# Patient Record
Sex: Female | Born: 1950 | Race: Black or African American | Hispanic: No | Smoking: Never smoker
Health system: Southern US, Community
[De-identification: ages and names within clinical notes are randomized; demographics above are authoritative.]

## PROBLEM LIST (undated history)

## (undated) DIAGNOSIS — E785 Hyperlipidemia, unspecified: Secondary | ICD-10-CM

## (undated) DIAGNOSIS — I10 Essential (primary) hypertension: Secondary | ICD-10-CM

## (undated) DIAGNOSIS — R600 Localized edema: Secondary | ICD-10-CM

## (undated) DIAGNOSIS — G4700 Insomnia, unspecified: Secondary | ICD-10-CM

## (undated) DIAGNOSIS — M549 Dorsalgia, unspecified: Secondary | ICD-10-CM

## (undated) DIAGNOSIS — Z9289 Personal history of other medical treatment: Secondary | ICD-10-CM

## (undated) DIAGNOSIS — J189 Pneumonia, unspecified organism: Secondary | ICD-10-CM

## (undated) DIAGNOSIS — Z860100 Personal history of colon polyps, unspecified: Secondary | ICD-10-CM

## (undated) DIAGNOSIS — R350 Frequency of micturition: Secondary | ICD-10-CM

## (undated) DIAGNOSIS — M2550 Pain in unspecified joint: Secondary | ICD-10-CM

## (undated) DIAGNOSIS — R51 Headache: Secondary | ICD-10-CM

## (undated) DIAGNOSIS — R609 Edema, unspecified: Secondary | ICD-10-CM

## (undated) DIAGNOSIS — Z8489 Family history of other specified conditions: Secondary | ICD-10-CM

## (undated) DIAGNOSIS — R569 Unspecified convulsions: Secondary | ICD-10-CM

## (undated) DIAGNOSIS — R519 Headache, unspecified: Secondary | ICD-10-CM

## (undated) DIAGNOSIS — M1990 Unspecified osteoarthritis, unspecified site: Secondary | ICD-10-CM

## (undated) HISTORY — PX: COLONOSCOPY: SHX174

## (undated) HISTORY — PX: ABDOMINAL HYSTERECTOMY: SHX81

## (undated) HISTORY — PX: ESOPHAGOGASTRODUODENOSCOPY: SHX1529

---

## 2001-09-01 ENCOUNTER — Emergency Department (HOSPITAL_COMMUNITY): Admission: EM | Admit: 2001-09-01 | Discharge: 2001-09-01 | Payer: Self-pay | Admitting: *Deleted

## 2009-05-13 ENCOUNTER — Emergency Department (HOSPITAL_BASED_OUTPATIENT_CLINIC_OR_DEPARTMENT_OTHER): Admission: EM | Admit: 2009-05-13 | Discharge: 2009-05-13 | Payer: Self-pay | Admitting: Emergency Medicine

## 2013-03-06 ENCOUNTER — Other Ambulatory Visit: Payer: Self-pay | Admitting: Orthopaedic Surgery

## 2013-03-16 ENCOUNTER — Encounter (HOSPITAL_COMMUNITY)
Admission: RE | Admit: 2013-03-16 | Discharge: 2013-03-16 | Disposition: A | Discharge status: Home / Self Care | Payer: Commercial Managed Care - PPO | Source: Ambulatory Visit | Attending: Orthopaedic Surgery | Admitting: Orthopaedic Surgery

## 2013-03-16 ENCOUNTER — Encounter (HOSPITAL_COMMUNITY): Payer: Self-pay

## 2013-03-16 DIAGNOSIS — Z01810 Encounter for preprocedural cardiovascular examination: Secondary | ICD-10-CM | POA: Insufficient documentation

## 2013-03-16 DIAGNOSIS — Z01818 Encounter for other preprocedural examination: Secondary | ICD-10-CM | POA: Insufficient documentation

## 2013-03-16 DIAGNOSIS — Z01812 Encounter for preprocedural laboratory examination: Secondary | ICD-10-CM | POA: Insufficient documentation

## 2013-03-16 HISTORY — DX: Essential (primary) hypertension: I10

## 2013-03-16 HISTORY — DX: Hyperlipidemia, unspecified: E78.5

## 2013-03-16 HISTORY — DX: Frequency of micturition: R35.0

## 2013-03-16 LAB — CBC WITH DIFFERENTIAL/PLATELET
Basophils Absolute: 0 10*3/uL (ref 0.0–0.1)
Eosinophils Relative: 0 % (ref 0–5)
HCT: 32.2 % — ABNORMAL LOW (ref 36.0–46.0)
Hemoglobin: 10.9 g/dL — ABNORMAL LOW (ref 12.0–15.0)
Lymphocytes Relative: 41 % (ref 12–46)
Lymphs Abs: 2.9 10*3/uL (ref 0.7–4.0)
MCV: 93.6 fL (ref 78.0–100.0)
Monocytes Absolute: 0.5 10*3/uL (ref 0.1–1.0)
Monocytes Relative: 7 % (ref 3–12)
Neutro Abs: 3.7 10*3/uL (ref 1.7–7.7)
RBC: 3.44 MIL/uL — ABNORMAL LOW (ref 3.87–5.11)
RDW: 12.6 % (ref 11.5–15.5)
WBC: 7.1 10*3/uL (ref 4.0–10.5)

## 2013-03-16 LAB — URINALYSIS, ROUTINE W REFLEX MICROSCOPIC
Bilirubin Urine: NEGATIVE
Glucose, UA: NEGATIVE mg/dL
Leukocytes, UA: NEGATIVE
Nitrite: NEGATIVE
Specific Gravity, Urine: 1.014 (ref 1.005–1.030)
pH: 7 (ref 5.0–8.0)

## 2013-03-16 LAB — SURGICAL PCR SCREEN: Staphylococcus aureus: NEGATIVE

## 2013-03-16 LAB — APTT: aPTT: 33 seconds (ref 24–37)

## 2013-03-16 LAB — BASIC METABOLIC PANEL
CO2: 25 mEq/L (ref 19–32)
Chloride: 102 mEq/L (ref 96–112)
Creatinine, Ser: 0.94 mg/dL (ref 0.50–1.10)
GFR calc Af Amer: 74 mL/min — ABNORMAL LOW (ref >90)
Potassium: 3.8 mEq/L (ref 3.5–5.1)
Sodium: 139 mEq/L (ref 135–145)

## 2013-03-16 NOTE — Progress Notes (Signed)
03/16/13 1414  OBSTRUCTIVE SLEEP APNEA  Have you ever been diagnosed with sleep apnea through a sleep study? No  Do you snore loudly (loud enough to be heard through closed doors)?  1  Do you often feel tired, fatigued, or sleepy during the daytime? 1  Has anyone observed you stop breathing during your sleep? 0  Do you have, or are you being treated for high blood pressure? 1  BMI more than 35 kg/m2? 1  Age over 61 years old? 1  Neck circumference greater than 40 cm/18 inches? 0  Gender: 0  Obstructive Sleep Apnea Score 5  Score 4 or greater  Results sent to PCP

## 2013-03-16 NOTE — Pre-Procedure Instructions (Addendum)
Lamesha Tibbits Reindel  03/16/2013   Your procedure is scheduled on:  May 29  Report to [facility] at 09:00 AM.  Call this number if you have problems the morning of surgery: [phone]   Remember:   Do not eat food or drink liquids after midnight.   Take these medicines the morning of surgery with A SIP OF WATER: Hydrocodone (if needed), Oxybutin    Do not wear jewelry, make-up or nail polish.  Do not wear lotions, powders, or perfumes. You may wear deodorant.  Do not shave 48 hours prior to surgery. Men may shave face and neck.  Do not bring valuables to the hospital.  Contacts, dentures or bridgework may not be worn into surgery.  Leave suitcase in the car. After surgery it may be brought to your room.  For patients admitted to the hospital, checkout time is 11:00 AM the day of discharge.   Special Instructions: Shower using CHG 2 nights before surgery and the night before surgery.  If you shower the day of surgery use CHG.  Use special wash - you have one bottle of CHG for all showers.  You should use approximately 1/3 of the bottle for each shower.   Please read over the following fact sheets that you were given: Pain Booklet, Coughing and Deep Breathing, Blood Transfusion Information and Surgical Site Infection Prevention

## 2013-03-19 NOTE — H&P (Signed)
TOTAL HIP ADMISSION H&P  Patient is admitted for right total hip arthroplasty.  Subjective:  Chief Complaint: right hip pain  HPI: Angel Wolf, 62 y.o. female, has a history of pain and functional disability in the right hip(s) due to arthritis and patient has failed non-surgical conservative treatments for greater than 12 weeks to include NSAID's and/or analgesics, flexibility and strengthening excercises, weight reduction as appropriate and activity modification.  Onset of symptoms was gradual starting 4 years ago with gradually worsening course since that time.The patient noted no past surgery on the right hip(s).  Patient currently rates pain in the right hip at 8 out of 10 with activity. Patient has night pain, worsening of pain with activity and weight bearing, trendelenberg gait, pain that interfers with activities of daily living and pain with passive range of motion. Patient has evidence of joint space narrowing and avn per mri scan by imaging studies. This condition presents safety issues increasing the risk of falls. This patient has had avascular necrosis of the hip, acetabular fracture, hip dysplasia.  There is no current active infection.  There are no active problems to display for this patient.  Past Medical History  Diagnosis Date  . Hypertension   . Frequent urination   . Hyperlipemia   . History of anemia     Past Surgical History  Procedure Laterality Date  . Abdominal hysterectomy      No prescriptions prior to admission   No Known Allergies  History  Substance Use Topics  . Smoking status: Never Smoker   . Smokeless tobacco: Not on file  . Alcohol Use: Yes     Comment: Rare on special occasions    No family history on file.   Review of Systems  Constitutional: Negative.   HENT: Negative.   Eyes: Negative.   Respiratory: Negative.   Cardiovascular: Negative.   Gastrointestinal: Negative.   Genitourinary: Negative.   Musculoskeletal: Positive  for joint pain.  Skin: Negative.   Neurological: Negative.   Endo/Heme/Allergies: Negative.   Psychiatric/Behavioral: Negative.     Objective:  Physical Exam  Constitutional: She is oriented to person, place, and time. She appears well-nourished.  HENT:  Head: Normocephalic.  Eyes: Pupils are equal, round, and reactive to light.  Neck: Normal range of motion.  Cardiovascular: Normal rate.   Respiratory: Breath sounds normal.  GI: Bowel sounds are normal.  Musculoskeletal:  r hip pain with weight bearing. Severe pain with rotation which is very limited. Good n/v  Neurological: She is oriented to person, place, and time.  Skin: Skin is warm.  Psychiatric: She has a normal mood and affect.    Vital signs in last 24 hours:    Labs:   There is no height or weight on file to calculate BMI.   Imaging Review Plain radiographs demonstrate moderate degenerative joint disease of the right hip(s). The bone quality appears to be good for age and reported activity level.  Assessment/Plan:  End stage arthritis, right hip(s)  The patient history, physical examination, clinical judgement of the provider and imaging studies are consistent with end stage degenerative joint disease of the right hip(s) and total hip arthroplasty is deemed medically necessary. The treatment options including medical management, injection therapy, arthroscopy and arthroplasty were discussed at length. The risks and benefits of total hip arthroplasty were presented and reviewed. The risks due to aseptic loosening, infection, stiffness, dislocation/subluxation,  thromboembolic complications and other imponderables were discussed.  The patient acknowledged the explanation, agreed to  proceed with the plan and consent was signed. Patient is being admitted for inpatient treatment for surgery, pain control, PT, OT, prophylactic antibiotics, VTE prophylaxis, progressive ambulation and ADL's and discharge planning.The  patient is planning to be discharged home with home health services

## 2013-03-22 MED ORDER — CEFAZOLIN SODIUM-DEXTROSE 2-3 GM-% IV SOLR
2.0000 g | INTRAVENOUS | Status: AC
  Administered 2013-03-23: 2 g via INTRAVENOUS

## 2013-03-22 MED ORDER — CHLORHEXIDINE GLUCONATE 4 % EX LIQD: 60.0000 mL | Freq: Once | CUTANEOUS | Status: DC

## 2013-03-22 NOTE — Progress Notes (Signed)
Pt called and confirmed arrival time of 0800.

## 2013-03-22 NOTE — Progress Notes (Signed)
I left a phone message on pt s home number of ti,e change, pt to arrive at 0800.  I asked patient to return call to confirm.

## 2013-03-23 ENCOUNTER — Encounter (HOSPITAL_COMMUNITY)
Admission: RE | Disposition: A | Discharge status: Home Health | Payer: Self-pay | Source: Ambulatory Visit | Attending: Orthopaedic Surgery

## 2013-03-23 ENCOUNTER — Inpatient Hospital Stay (HOSPITAL_COMMUNITY): Payer: Commercial Managed Care - PPO

## 2013-03-23 ENCOUNTER — Inpatient Hospital Stay (HOSPITAL_COMMUNITY): Payer: Commercial Managed Care - PPO | Admitting: Anesthesiology

## 2013-03-23 ENCOUNTER — Encounter (HOSPITAL_COMMUNITY): Payer: Self-pay | Admitting: Anesthesiology

## 2013-03-23 ENCOUNTER — Encounter (HOSPITAL_COMMUNITY): Payer: Self-pay | Admitting: *Deleted

## 2013-03-23 ENCOUNTER — Inpatient Hospital Stay (HOSPITAL_COMMUNITY)
Admission: RE | Admit: 2013-03-23 | Discharge: 2013-03-26 | DRG: 470 | Disposition: A | Discharge status: Home Health | Payer: Commercial Managed Care - PPO | Source: Ambulatory Visit | Attending: Orthopaedic Surgery | Admitting: Orthopaedic Surgery

## 2013-03-23 DIAGNOSIS — M169 Osteoarthritis of hip, unspecified: Secondary | ICD-10-CM | POA: Diagnosis present

## 2013-03-23 DIAGNOSIS — M1610 Unilateral primary osteoarthritis, unspecified hip: Principal | ICD-10-CM | POA: Diagnosis present

## 2013-03-23 DIAGNOSIS — D62 Acute posthemorrhagic anemia: Secondary | ICD-10-CM | POA: Diagnosis not present

## 2013-03-23 DIAGNOSIS — E785 Hyperlipidemia, unspecified: Secondary | ICD-10-CM | POA: Diagnosis present

## 2013-03-23 DIAGNOSIS — Z7982 Long term (current) use of aspirin: Secondary | ICD-10-CM

## 2013-03-23 DIAGNOSIS — Z79899 Other long term (current) drug therapy: Secondary | ICD-10-CM

## 2013-03-23 DIAGNOSIS — I10 Essential (primary) hypertension: Secondary | ICD-10-CM | POA: Diagnosis present

## 2013-03-23 HISTORY — PX: TOTAL HIP ARTHROPLASTY: SHX124

## 2013-03-23 LAB — TYPE AND SCREEN
ABO/RH(D): A POS
Antibody Screen: NEGATIVE

## 2013-03-23 SURGERY — ARTHROPLASTY, HIP, TOTAL, ANTERIOR APPROACH
Anesthesia: General | Site: Hip | Laterality: Right | Wound class: Clean

## 2013-03-23 MED ORDER — PHENOL 1.4 % MT LIQD: 1.0000 | OROMUCOSAL | Status: DC | PRN

## 2013-03-23 MED ORDER — METHOCARBAMOL 500 MG PO TABS
ORAL_TABLET | ORAL | Status: AC
  Filled 2013-03-23: qty 1

## 2013-03-23 MED ORDER — ONDANSETRON HCL 4 MG/2ML IJ SOLN: 4.0000 mg | Freq: Four times a day (QID) | INTRAMUSCULAR | Status: DC | PRN

## 2013-03-23 MED ORDER — ALUM & MAG HYDROXIDE-SIMETH 200-200-20 MG/5ML PO SUSP: 30.0000 mL | ORAL | Status: DC | PRN

## 2013-03-23 MED ORDER — HYDROCHLOROTHIAZIDE 12.5 MG PO CAPS
12.5000 mg | ORAL_CAPSULE | Freq: Every day | ORAL | Status: DC
  Administered 2013-03-23 – 2013-03-26 (×3): 12.5 mg via ORAL
  Filled 2013-03-23 (×4): qty 1

## 2013-03-23 MED ORDER — HYDRALAZINE HCL 20 MG/ML IJ SOLN
INTRAMUSCULAR | Status: AC
  Administered 2013-03-23: 10 mg
  Filled 2013-03-23: qty 1

## 2013-03-23 MED ORDER — OXYCODONE HCL 5 MG/5ML PO SOLN: 5.0000 mg | Freq: Once | ORAL | Status: AC | PRN

## 2013-03-23 MED ORDER — ONDANSETRON HCL 4 MG/2ML IJ SOLN
INTRAMUSCULAR | Status: AC
  Filled 2013-03-23: qty 2

## 2013-03-23 MED ORDER — HYDROMORPHONE HCL PF 1 MG/ML IJ SOLN
0.2500 mg | INTRAMUSCULAR | Status: DC | PRN
  Administered 2013-03-23 (×4): 0.5 mg via INTRAVENOUS

## 2013-03-23 MED ORDER — LACTATED RINGERS IV SOLN
INTRAVENOUS | Status: DC
  Administered 2013-03-24: 02:00:00 via INTRAVENOUS

## 2013-03-23 MED ORDER — PROPOFOL 10 MG/ML IV BOLUS
INTRAVENOUS | Status: DC | PRN
  Administered 2013-03-23: 150 mg via INTRAVENOUS

## 2013-03-23 MED ORDER — ONDANSETRON HCL 4 MG PO TABS
4.0000 mg | ORAL_TABLET | Freq: Four times a day (QID) | ORAL | Status: DC | PRN
  Administered 2013-03-26: 4 mg via ORAL
  Filled 2013-03-23: qty 1

## 2013-03-23 MED ORDER — LOSARTAN POTASSIUM-HCTZ 100-12.5 MG PO TABS: 1.0000 | ORAL_TABLET | Freq: Every day | ORAL | Status: DC

## 2013-03-23 MED ORDER — LIDOCAINE HCL (CARDIAC) 20 MG/ML IV SOLN
INTRAVENOUS | Status: DC | PRN
  Administered 2013-03-23: 80 mg via INTRAVENOUS

## 2013-03-23 MED ORDER — METHOCARBAMOL 500 MG PO TABS
500.0000 mg | ORAL_TABLET | Freq: Four times a day (QID) | ORAL | Status: DC | PRN
  Administered 2013-03-23 – 2013-03-25 (×5): 500 mg via ORAL
  Filled 2013-03-23 (×4): qty 1

## 2013-03-23 MED ORDER — SIMVASTATIN 20 MG PO TABS
20.0000 mg | ORAL_TABLET | Freq: Every day | ORAL | Status: DC
  Administered 2013-03-23 – 2013-03-25 (×3): 20 mg via ORAL
  Filled 2013-03-23 (×4): qty 1

## 2013-03-23 MED ORDER — OXYBUTYNIN CHLORIDE 5 MG PO TABS
5.0000 mg | ORAL_TABLET | Freq: Every day | ORAL | Status: DC
Start: 2013-03-24 — End: 2013-03-26
  Administered 2013-03-24 – 2013-03-26 (×2): 5 mg via ORAL
  Filled 2013-03-23 (×3): qty 1

## 2013-03-23 MED ORDER — HYDROMORPHONE HCL PF 1 MG/ML IJ SOLN
INTRAMUSCULAR | Status: AC
  Filled 2013-03-23: qty 1

## 2013-03-23 MED ORDER — LIDOCAINE HCL 4 % MT SOLN
OROMUCOSAL | Status: DC | PRN
  Administered 2013-03-23: 4 mL via TOPICAL

## 2013-03-23 MED ORDER — BISACODYL 5 MG PO TBEC
5.0000 mg | DELAYED_RELEASE_TABLET | Freq: Every day | ORAL | Status: DC | PRN
  Administered 2013-03-25: 5 mg via ORAL
  Filled 2013-03-23: qty 1

## 2013-03-23 MED ORDER — ONDANSETRON HCL 4 MG/2ML IJ SOLN
4.0000 mg | Freq: Four times a day (QID) | INTRAMUSCULAR | Status: DC | PRN
  Administered 2013-03-25: 4 mg via INTRAVENOUS
  Filled 2013-03-23: qty 2

## 2013-03-23 MED ORDER — OXYCODONE HCL 5 MG PO TABS
5.0000 mg | ORAL_TABLET | Freq: Once | ORAL | Status: AC | PRN
  Administered 2013-03-23: 5 mg via ORAL

## 2013-03-23 MED ORDER — ASPIRIN EC 325 MG PO TBEC
325.0000 mg | DELAYED_RELEASE_TABLET | Freq: Two times a day (BID) | ORAL | Status: DC
  Administered 2013-03-24 – 2013-03-26 (×5): 325 mg via ORAL
  Filled 2013-03-23 (×7): qty 1

## 2013-03-23 MED ORDER — DOCUSATE SODIUM 100 MG PO CAPS
100.0000 mg | ORAL_CAPSULE | Freq: Two times a day (BID) | ORAL | Status: DC
  Administered 2013-03-23 – 2013-03-26 (×6): 100 mg via ORAL
  Filled 2013-03-23 (×7): qty 1

## 2013-03-23 MED ORDER — ONDANSETRON HCL 4 MG/2ML IJ SOLN
4.0000 mg | Freq: Once | INTRAMUSCULAR | Status: AC | PRN
  Administered 2013-03-23: 4 mg via INTRAVENOUS

## 2013-03-23 MED ORDER — LOSARTAN POTASSIUM 50 MG PO TABS
100.0000 mg | ORAL_TABLET | Freq: Every day | ORAL | Status: DC
  Administered 2013-03-23 – 2013-03-26 (×3): 100 mg via ORAL
  Filled 2013-03-23 (×4): qty 2

## 2013-03-23 MED ORDER — LACTATED RINGERS IV SOLN
INTRAVENOUS | Status: DC
  Administered 2013-03-23: 09:00:00 via INTRAVENOUS

## 2013-03-23 MED ORDER — ACETAMINOPHEN 325 MG PO TABS: 650.0000 mg | ORAL_TABLET | Freq: Four times a day (QID) | ORAL | Status: DC | PRN

## 2013-03-23 MED ORDER — DEXTROSE 5 % IV SOLN: 500.0000 mg | Freq: Four times a day (QID) | INTRAVENOUS | Status: DC | PRN

## 2013-03-23 MED ORDER — ROCURONIUM BROMIDE 100 MG/10ML IV SOLN
INTRAVENOUS | Status: DC | PRN
  Administered 2013-03-23: 50 mg via INTRAVENOUS

## 2013-03-23 MED ORDER — SODIUM CHLORIDE 0.9 % IV SOLN
10.0000 mg | INTRAVENOUS | Status: DC | PRN
  Administered 2013-03-23: 5 ug/min via INTRAVENOUS

## 2013-03-23 MED ORDER — FENTANYL CITRATE 0.05 MG/ML IJ SOLN
INTRAMUSCULAR | Status: AC
  Administered 2013-03-23: 25 ug
  Filled 2013-03-23: qty 2

## 2013-03-23 MED ORDER — MIDAZOLAM HCL 5 MG/5ML IJ SOLN
INTRAMUSCULAR | Status: DC | PRN
  Administered 2013-03-23 (×2): 1 mg via INTRAVENOUS

## 2013-03-23 MED ORDER — OXYCODONE HCL 5 MG PO TABS
ORAL_TABLET | ORAL | Status: AC
  Filled 2013-03-23: qty 1

## 2013-03-23 MED ORDER — HYDROCODONE-ACETAMINOPHEN 7.5-325 MG PO TABS
1.0000 | ORAL_TABLET | ORAL | Status: DC | PRN
  Administered 2013-03-23: 1 via ORAL
  Administered 2013-03-24: 2 via ORAL
  Administered 2013-03-24: 1 via ORAL
  Administered 2013-03-24 – 2013-03-25 (×4): 2 via ORAL
  Filled 2013-03-23 (×6): qty 2
  Filled 2013-03-23: qty 1

## 2013-03-23 MED ORDER — GLYCOPYRROLATE 0.2 MG/ML IJ SOLN
INTRAMUSCULAR | Status: DC | PRN
  Administered 2013-03-23: .4 mg via INTRAVENOUS

## 2013-03-23 MED ORDER — NEOSTIGMINE METHYLSULFATE 1 MG/ML IJ SOLN
INTRAMUSCULAR | Status: DC | PRN
  Administered 2013-03-23: 4 mg via INTRAVENOUS

## 2013-03-23 MED ORDER — LACTATED RINGERS IV SOLN
INTRAVENOUS | Status: DC | PRN
  Administered 2013-03-23 (×2): via INTRAVENOUS

## 2013-03-23 MED ORDER — CEFAZOLIN SODIUM-DEXTROSE 2-3 GM-% IV SOLR
INTRAVENOUS | Status: AC
  Filled 2013-03-23: qty 50

## 2013-03-23 MED ORDER — 0.9 % SODIUM CHLORIDE (POUR BTL) OPTIME
TOPICAL | Status: DC | PRN
  Administered 2013-03-23: 1000 mL

## 2013-03-23 MED ORDER — METOCLOPRAMIDE HCL 10 MG PO TABS: 5.0000 mg | ORAL_TABLET | Freq: Three times a day (TID) | ORAL | Status: DC | PRN

## 2013-03-23 MED ORDER — FENTANYL CITRATE 0.05 MG/ML IJ SOLN
INTRAMUSCULAR | Status: DC | PRN
  Administered 2013-03-23: 25 ug via INTRAVENOUS
  Administered 2013-03-23: 50 ug via INTRAVENOUS
  Administered 2013-03-23: 150 ug via INTRAVENOUS
  Administered 2013-03-23: 50 ug via INTRAVENOUS
  Administered 2013-03-23: 25 ug via INTRAVENOUS
  Administered 2013-03-23: 100 ug via INTRAVENOUS

## 2013-03-23 MED ORDER — METOCLOPRAMIDE HCL 5 MG/ML IJ SOLN: 5.0000 mg | Freq: Three times a day (TID) | INTRAMUSCULAR | Status: DC | PRN

## 2013-03-23 MED ORDER — FERROUS SULFATE 325 (65 FE) MG PO TABS
325.0000 mg | ORAL_TABLET | Freq: Every day | ORAL | Status: DC
  Administered 2013-03-24 – 2013-03-25 (×2): 325 mg via ORAL
  Filled 2013-03-23 (×3): qty 1

## 2013-03-23 MED ORDER — CEFAZOLIN SODIUM-DEXTROSE 2-3 GM-% IV SOLR
2.0000 g | Freq: Four times a day (QID) | INTRAVENOUS | Status: AC
  Administered 2013-03-23 – 2013-03-24 (×2): 2 g via INTRAVENOUS
  Filled 2013-03-23 (×2): qty 50

## 2013-03-23 MED ORDER — ACETAMINOPHEN 650 MG RE SUPP: 650.0000 mg | Freq: Four times a day (QID) | RECTAL | Status: DC | PRN

## 2013-03-23 MED ORDER — MENTHOL 3 MG MT LOZG: 1.0000 | LOZENGE | OROMUCOSAL | Status: DC | PRN

## 2013-03-23 MED ORDER — HYDROMORPHONE HCL PF 1 MG/ML IJ SOLN
0.5000 mg | INTRAMUSCULAR | Status: DC | PRN
  Administered 2013-03-23 – 2013-03-24 (×3): 1 mg via INTRAVENOUS
  Filled 2013-03-23 (×3): qty 1

## 2013-03-23 SURGICAL SUPPLY — 52 items
BLADE SAW SGTL 18X1.27X75 (BLADE) ×2 IMPLANT
BLADE SURG ROTATE 9660 (MISCELLANEOUS) IMPLANT
CELLS DAT CNTRL 66122 CELL SVR (MISCELLANEOUS) ×1 IMPLANT
CLOTH BEACON ORANGE TIMEOUT ST (SAFETY) ×2 IMPLANT
COVER BACK TABLE 24X17X13 BIG (DRAPES) IMPLANT
COVER SURGICAL LIGHT HANDLE (MISCELLANEOUS) ×2 IMPLANT
DRAPE C-ARM 42X72 X-RAY (DRAPES) ×2 IMPLANT
DRAPE STERI IOBAN 125X83 (DRAPES) ×2 IMPLANT
DRAPE U-SHAPE 47X51 STRL (DRAPES) ×6 IMPLANT
DRSG MEPILEX BORDER 4X12 (GAUZE/BANDAGES/DRESSINGS) IMPLANT
DRSG MEPILEX BORDER 4X8 (GAUZE/BANDAGES/DRESSINGS) ×2 IMPLANT
DURAPREP 26ML APPLICATOR (WOUND CARE) ×2 IMPLANT
ELECT BLADE 4.0 EZ CLEAN MEGAD (MISCELLANEOUS)
ELECT BLADE TIP CTD 4 INCH (ELECTRODE) ×2 IMPLANT
ELECT CAUTERY BLADE 6.4 (BLADE) ×2 IMPLANT
ELECT REM PT RETURN 9FT ADLT (ELECTROSURGICAL) ×2
ELECTRODE BLDE 4.0 EZ CLN MEGD (MISCELLANEOUS) IMPLANT
ELECTRODE REM PT RTRN 9FT ADLT (ELECTROSURGICAL) ×1 IMPLANT
FACESHIELD LNG OPTICON STERILE (SAFETY) ×4 IMPLANT
GAUZE XEROFORM 1X8 LF (GAUZE/BANDAGES/DRESSINGS) ×2 IMPLANT
GLOVE BIO SURGEON STRL SZ8 (GLOVE) ×2 IMPLANT
GLOVE BIO SURGEON STRL SZ8.5 (GLOVE) ×2 IMPLANT
GLOVE BIOGEL PI IND STRL 8 (GLOVE) ×1 IMPLANT
GLOVE BIOGEL PI IND STRL 8.5 (GLOVE) ×1 IMPLANT
GLOVE BIOGEL PI INDICATOR 8 (GLOVE) ×1
GLOVE BIOGEL PI INDICATOR 8.5 (GLOVE) ×1
GOWN PREVENTION PLUS LG XLONG (DISPOSABLE) IMPLANT
GOWN STRL NON-REIN LRG LVL3 (GOWN DISPOSABLE) ×4 IMPLANT
GOWN STRL REIN XL XLG (GOWN DISPOSABLE) ×2 IMPLANT
KIT BASIN OR (CUSTOM PROCEDURE TRAY) ×2 IMPLANT
KIT ROOM TURNOVER OR (KITS) ×2 IMPLANT
MANIFOLD NEPTUNE II (INSTRUMENTS) ×2 IMPLANT
NS IRRIG 1000ML POUR BTL (IV SOLUTION) ×2 IMPLANT
PACK TOTAL JOINT (CUSTOM PROCEDURE TRAY) ×2 IMPLANT
PAD ARMBOARD 7.5X6 YLW CONV (MISCELLANEOUS) ×4 IMPLANT
RETRACTOR WND ALEXIS 18 MED (MISCELLANEOUS) ×1 IMPLANT
RTRCTR WOUND ALEXIS 18CM MED (MISCELLANEOUS) ×2
SPONGE LAP 18X18 X RAY DECT (DISPOSABLE) ×2 IMPLANT
SPONGE LAP 4X18 X RAY DECT (DISPOSABLE) IMPLANT
STAPLER VISISTAT 35W (STAPLE) ×2 IMPLANT
SUT ETHIBOND NAB CT1 #1 30IN (SUTURE) ×4 IMPLANT
SUT VIC AB 0 CT1 27 (SUTURE)
SUT VIC AB 0 CT1 27XBRD ANBCTR (SUTURE) IMPLANT
SUT VIC AB 1 CT1 27 (SUTURE) ×2
SUT VIC AB 1 CT1 27XBRD ANBCTR (SUTURE) ×1 IMPLANT
SUT VIC AB 2-0 CT1 27 (SUTURE) ×2
SUT VIC AB 2-0 CT1 TAPERPNT 27 (SUTURE) ×1 IMPLANT
SUT VLOC 180 0 24IN GS25 (SUTURE) ×2 IMPLANT
TOWEL OR 17X24 6PK STRL BLUE (TOWEL DISPOSABLE) ×2 IMPLANT
TOWEL OR 17X26 10 PK STRL BLUE (TOWEL DISPOSABLE) ×4 IMPLANT
TRAY FOLEY CATH 14FR (SET/KITS/TRAYS/PACK) IMPLANT
WATER STERILE IRR 1000ML POUR (IV SOLUTION) ×4 IMPLANT

## 2013-03-23 NOTE — Anesthesia Preprocedure Evaluation (Signed)
Anesthesia Evaluation  Patient identified by MRN, date of birth, ID band Patient awake    Reviewed: Allergy & Precautions, H&P , NPO status , Patient's Chart, lab work & pertinent test results  Airway Mallampati: II  Neck ROM: full    Dental   Pulmonary          Cardiovascular hypertension,     Neuro/Psych    GI/Hepatic   Endo/Other  obese  Renal/GU      Musculoskeletal   Abdominal   Peds  Hematology   Anesthesia Other Findings   Reproductive/Obstetrics                           Anesthesia Physical Anesthesia Plan  ASA: II  Anesthesia Plan: General   Post-op Pain Management:    Induction: Intravenous  Airway Management Planned: Oral ETT  Additional Equipment:   Intra-op Plan:   Post-operative Plan: Extubation in OR  Informed Consent: I have reviewed the patients History and Physical, chart, labs and discussed the procedure including the risks, benefits and alternatives for the proposed anesthesia with the patient or authorized representative who has indicated his/her understanding and acceptance.     Plan Discussed with: CRNA, Anesthesiologist and Surgeon  Anesthesia Plan Comments:         Anesthesia Quick Evaluation

## 2013-03-23 NOTE — Interval H&P Note (Signed)
History and Physical Interval Note:  03/23/2013 9:36 AM  Angel Wolf  has presented today for surgery, with the diagnosis of RIGHT HIP DEGENERATIVE JOINT DISEASE  The various methods of treatment have been discussed with the patient and family. After consideration of risks, benefits and other options for treatment, the patient has consented to  Procedure(s): RIGHT TOTAL HIP ARTHROPLASTY ANTERIOR APPROACH (Right) as a surgical intervention .  The patient's history has been reviewed, patient examined, no change in status, stable for surgery.  I have reviewed the patient's chart and labs.  Questions were answered to the patient's satisfaction.     Foy Mungia G

## 2013-03-23 NOTE — Transfer of Care (Signed)
Immediate Anesthesia Transfer of Care Note  Patient: Angel Wolf  Procedure(s) Performed: Procedure(s): RIGHT TOTAL HIP ARTHROPLASTY ANTERIOR APPROACH (Right)  Patient Location: PACU  Anesthesia Type:General  Level of Consciousness: awake, sedated, patient cooperative, confused and responds to stimulation  Airway & Oxygen Therapy: Patient Spontanous Breathing and Patient connected to nasal cannula oxygen  Post-op Assessment: Report given to PACU RN, Post -op Vital signs reviewed and stable, Patient moving all extremities and Patient moving all extremities X 4  Post vital signs: Reviewed and stable  Complications: No apparent anesthesia complications

## 2013-03-23 NOTE — Progress Notes (Signed)
Lunch relief by Silvestre Gunner RN

## 2013-03-23 NOTE — Progress Notes (Signed)
Pt rates pain 8/10 after 2 mg IV Dilaudid, po OXY IR & Robaxin.  RR 8-10. SBP 180-190's. Dr Alonna Buckler notified. IV Hydralazine given for elev. BP--will cont to monitor BP & pain issues.

## 2013-03-23 NOTE — Anesthesia Procedure Notes (Signed)
Procedure Name: Intubation Date/Time: 03/23/2013 10:11 AM Performed by: Armandina Gemma Pre-anesthesia Checklist: Patient identified, Timeout performed, Emergency Drugs available, Suction available and Patient being monitored Patient Re-evaluated:Patient Re-evaluated prior to inductionOxygen Delivery Method: Circle system utilized Preoxygenation: Pre-oxygenation with 100% oxygen Intubation Type: IV induction Ventilation: Mask ventilation without difficulty Laryngoscope Size: Miller and 2 Grade View: Grade I Tube type: Oral Tube size: 7.0 mm Number of attempts: 1 Airway Equipment and Method: Stylet Placement Confirmation: ETT inserted through vocal cords under direct vision,  breath sounds checked- equal and bilateral and positive ETCO2 Secured at: 22 cm Tube secured with: Tape Dental Injury: Teeth and Oropharynx as per pre-operative assessment  Comments: IV induction Hodierene- intubation AM atraumatic teeth as preop

## 2013-03-23 NOTE — Op Note (Signed)
PRE-OP DIAGNOSIS:  RIGHT HIP DEGENERATIVE JOINT DISEASE POST-OP DIAGNOSIS:  RIGHT HIP DEGENERATIVE JOINT DISEASE PROCEDURE:  Procedure(s): RIGHT TOTAL HIP ARTHROPLASTY ANTERIOR APPROACH ANESTHESIA:  General SURGEON:  Marcene Corning MD ASSISTANT:  Lindwood Qua PA-C and Patrick Jupiter RNFA   INDICATIONS FOR PROCEDURE:  The patient is a 62 y.o. female with a long history of a painful hip.  This has persisted despite multiple conservative measures.  The patient has persisted with pain and dysfunction making rest and activity difficult.  A total hip replacement is offered as surgical treatment.  Informed operative consent was obtained after discussion of possible complications including reaction to anesthesia, infection, neurovascular injury, dislocation, DVT, PE, and death.  The importance of the postoperative rehab program to optimize result was stressed with the patient.  SUMMARY OF FINDINGS AND PROCEDURE:  Under general anesthesia through a anterior approach an the Hana table a right THR was performed.  The patient had severe degenerative change and excellent bone quality.  We used DePuy components to replace the hip and these were size KA12 Corail femur capped with a +1 32mm ceramic hip ball.  On the acetabular side we used a size 50 Gription shell with a  plus 4 neutral polyethylene liner.  We did use a hole eliminator.  Bryna Colander assisted throughout and was invaluable to the completion of the case in that he helped position and retract while I performed the procedure.  He also closed simultaneously to help minimize OR time.  I used fluoroscopy throughout the case to check position of implants and leg lengths and read all of these views myself.  DESCRIPTION OF PROCEDURE:  The patient was taken to the OR suite where general anesthetic was applied.  The patient was then positioned on the Hana table supine.  All bony prominences were appropriately padded.  Prep and drape was then performed in  normal sterile fashion.  The patient was given Kefzol preoperative antibiotic and an appropriate time out was performed.  We then took an anterior approach to the right hip.  Dissection was taken through adipose to the tensor fascia lata fascia.  This structure was incised longitudinally and we dissected in the intermuscular interval just medial to this muscle.  Cobra retractors were placed superior and inferior to the femoral neck superficial to the capsule.  A capsular incision was then made and the retractors were placed along the femoral neck.  Xray was brought in to get a good level for the femoral neck cut which was made with an oscillating saw and osteotome.  The femoral head was removed with a corkscrew.  The acetabulum was exposed and some labral tissues were excised. Reaming was taken to the inside wall of the pelvis and sequentially up to 1 mm smaller than the actual component.  A trial of components was done and then the aforementioned acetabular shell was placed in appropriate tilt and anteversion confirmed by fluoroscopy. The liner was placed along with the hole eliminator and attention was turned to the femur.  The leg was brought down and over into adduction and the elevator bar was used to raise the femur up gently in the wound.  The piriformis was released with care taken to preserve the obturator internus attachment and all of the posterior capsule. The femur was reamed and then broached to the appropriate size.  A trial reduction was done and the aforementioned head and neck assembly gave [place] the best stability in extension with external rotation.  Leg lengths were  felt to be about equal by fluoroscopic exam.  The trial components were removed and the wound irrigated.  We then placed the femoral component in appropriate anteversion.  The head was applied to a dry stem neck and the hip again reduced.  It was again stable in the aforementioned position.  The would was irrigated again followed by  re-approximation of anterior capsule with ethibond suture. Tensor fascia was repaired with V-loc suture  followed by subcutaneous closure with #O and #2 undyed vicryl.  Skin was closed with staples followed by a sterile dressing.  EBL and IOF can be obtained from anesthesia records.  DISPOSITION:  The patient was extubated in the OR and taken to PACU in stable condition to be admitted to the Orthopedic Surgery for appropriate post-op care to include perioperative antibiotics and DVT prophylaxis.

## 2013-03-23 NOTE — Preoperative (Signed)
Beta Blockers   Reason not to administer Beta Blockers:Not Applicable 

## 2013-03-23 NOTE — Anesthesia Postprocedure Evaluation (Signed)
Anesthesia Post Note  Patient: Angel Wolf  Procedure(s) Performed: Procedure(s) (LRB): RIGHT TOTAL HIP ARTHROPLASTY ANTERIOR APPROACH (Right)  Anesthesia type: General  Patient location: PACU  Post pain: Pain level controlled and Adequate analgesia  Post assessment: Post-op Vital signs reviewed, Patient's Cardiovascular Status Stable, Respiratory Function Stable, Patent Airway and Pain level controlled  Last Vitals:  Filed Vitals:   03/23/13 1400  BP: 167/68  Pulse: 52  Temp:   Resp: 12    Post vital signs: Reviewed and stable  Level of consciousness: awake, alert  and oriented  Complications: No apparent anesthesia complications

## 2013-03-24 ENCOUNTER — Encounter (HOSPITAL_COMMUNITY): Payer: Self-pay | Admitting: Orthopaedic Surgery

## 2013-03-24 LAB — POCT I-STAT 4, (NA,K, GLUC, HGB,HCT)
HCT: 29 % — ABNORMAL LOW (ref 36.0–46.0)
Hemoglobin: 9.9 g/dL — ABNORMAL LOW (ref 12.0–15.0)
Sodium: 139 mEq/L (ref 135–145)

## 2013-03-24 LAB — CBC
HCT: 26.2 % — ABNORMAL LOW (ref 36.0–46.0)
MCV: 93.9 fL (ref 78.0–100.0)
RBC: 2.79 MIL/uL — ABNORMAL LOW (ref 3.87–5.11)
WBC: 9 10*3/uL (ref 4.0–10.5)

## 2013-03-24 LAB — BASIC METABOLIC PANEL
BUN: 12 mg/dL (ref 6–23)
CO2: 28 mEq/L (ref 19–32)
Chloride: 97 mEq/L (ref 96–112)
Creatinine, Ser: 0.96 mg/dL (ref 0.50–1.10)

## 2013-03-24 MED ORDER — METHOCARBAMOL 500 MG PO TABS: 500.0000 mg | ORAL_TABLET | Freq: Four times a day (QID) | ORAL | Status: DC | PRN

## 2013-03-24 MED ORDER — HYDROCODONE-ACETAMINOPHEN 7.5-325 MG PO TABS: 1.0000 | ORAL_TABLET | ORAL | Status: DC | PRN

## 2013-03-24 MED ORDER — FERROUS SULFATE 325 (65 FE) MG PO TABS: 325.0000 mg | ORAL_TABLET | Freq: Every day | ORAL | Status: AC

## 2013-03-24 MED ORDER — ASPIRIN 325 MG PO TBEC: 325.0000 mg | DELAYED_RELEASE_TABLET | Freq: Two times a day (BID) | ORAL | Status: DC

## 2013-03-24 NOTE — Evaluation (Signed)
Physical Therapy Evaluation Patient Details Name: Angel Wolf MRN: [id number] DOB: 1951/10/07 Today's Date: 03/24/2013 Time: 3086-5784 PT Time Calculation (min): 27 min  PT Assessment / Plan / Recommendation Clinical Impression  Pt is a 62 y.o. female s/p R THA POD#1. Pt limited in therapy today due to pain and nausea with activity. Requires max encouragement to participate. Pt presents with decreased mobility, decreased independence with gt/transfers. Will benefit from skilled PT to maximize functional mobility and ensure safe transition home with family. Will recommend HHPT upon acute D/C.    PT Assessment  Patient needs continued PT services    Follow Up Recommendations  Home health PT;Supervision/Assistance - 24 hour    Does the patient have the potential to tolerate intense rehabilitation      Barriers to Discharge None      Equipment Recommendations  Rolling walker with 5" wheels;Other (comment) (3 in 1)    Recommendations for Other Services     Frequency 7X/week    Precautions / Restrictions Precautions Precautions: Fall Precaution Comments: given handout on exercises for THA protocol Restrictions Weight Bearing Restrictions: Yes RLE Weight Bearing: Weight bearing as tolerated   Pertinent Vitals/Pain 8/10; pt premedicated.       Mobility  Bed Mobility Bed Mobility: Supine to Sit;Sitting - Scoot to Edge of Bed Supine to Sit: 4: Min assist;HOB elevated;With rails Sitting - Scoot to Edge of Bed: 4: Min guard;With rail Details for Bed Mobility Assistance: requires (A) to advance R LE to/off EOB; increased time due to pain; pt became nausous and began to vomit while sitting at EOB; deferred amb at this time; agreeable to SPT to chair  Transfers Transfers: Sit to Stand;Stand to Sit;Stand Pivot Transfers Sit to Stand: 3: Mod assist;From bed;From elevated surface Stand to Sit: 3: Mod assist;To chair/3-in-1;With armrests;With upper extremity assist Stand Pivot  Transfers: 3: Mod assist Details for Transfer Assistance: verbal cues for hand placement and safety with RW;  pt requires (A) to steady when intially standing due to decreased ability to WB through R LE due to pain; denies dizziness  Ambulation/Gait Ambulation/Gait Assistance: Not tested (comment) Stairs: No Wheelchair Mobility Wheelchair Mobility: No    Exercises Total Joint Exercises Ankle Circles/Pumps: AROM;Both;10 reps Gluteal Sets: AROM;Both;Seated Long Arc Quad: AROM;Right;10 reps;Seated   PT Diagnosis: Difficulty walking;Acute pain  PT Problem List: Decreased strength;Decreased range of motion;Decreased activity tolerance;Decreased balance;Decreased mobility;Decreased knowledge of use of DME;Pain PT Treatment Interventions: DME instruction;Gait training;Stair training;Functional mobility training;Therapeutic activities;Therapeutic exercise;Balance training;Neuromuscular re-education;Patient/family education   PT Goals Acute Rehab PT Goals PT Goal Formulation: With patient Time For Goal Achievement: 03/31/13 Potential to Achieve Goals: Good Pt will go Supine/Side to Sit: with modified independence PT Goal: Supine/Side to Sit - Progress: Goal set today Pt will go Sit to Supine/Side: with modified independence PT Goal: Sit to Supine/Side - Progress: Goal set today Pt will go Sit to Stand: with modified independence PT Goal: Sit to Stand - Progress: Goal set today Pt will go Stand to Sit: with modified independence PT Goal: Stand to Sit - Progress: Goal set today Pt will Ambulate: >150 feet;with modified independence;with rolling walker PT Goal: Ambulate - Progress: Goal set today Pt will Go Up / Down Stairs: 1-2 stairs;with rolling walker;with supervision PT Goal: Up/Down Stairs - Progress: Goal set today Pt will Perform Home Exercise Program: Independently PT Goal: Perform Home Exercise Program - Progress: Goal set today  Visit Information  Last PT Received On:  03/24/13 Assistance Needed: +1 PT/OT Co-Evaluation/Treatment: Yes  Subjective Data  Subjective: pt lying supine with friend present; agreeable to therapy Patient Stated Goal: home with son and friend    Prior Functioning  Home Living Lives With: Son;Other (Comment) (friend) Available Help at Discharge: Family;Friend(s);Available 24 hours/day Type of Home: House Home Access: Stairs to enter [employer] of Steps: 1 Entrance Stairs-Rails: None Home Layout: One level Bathroom Shower/Tub: Forensic scientist: Standard Bathroom Accessibility: Yes How Accessible: Accessible via walker Home Adaptive Equipment: Straight cane Prior Function Level of Independence: Independent Able to Take Stairs?: Yes Driving: Yes Vocation: Full time employment Communication Communication: No difficulties Dominant Hand: Right    Cognition  Cognition Arousal/Alertness: Awake/alert Behavior During Therapy: WFL for tasks assessed/performed Overall Cognitive Status: Within Functional Limits for tasks assessed    Extremity/Trunk Assessment Right Lower Extremity Assessment RLE ROM/Strength/Tone: Unable to fully assess;WFL for tasks assessed (unable to assess hip due to pain; knee/ankle grossly 4/5) RLE Sensation: WFL - Light Touch Left Lower Extremity Assessment LLE ROM/Strength/Tone: WFL for tasks assessed LLE Sensation: WFL - Light Touch Trunk Assessment Trunk Assessment: Kyphotic   Balance Balance Balance Assessed: No  End of Session PT - End of Session Equipment Utilized During Treatment: Gait belt Activity Tolerance: Patient limited by pain;Other (comment) (nausea ) Patient left: in chair;with call bell/phone within reach;with family/visitor present Nurse Communication: Mobility status  GP     Donell Sievert, [place] [phone] 03/24/2013, 9:27 AM

## 2013-03-24 NOTE — Progress Notes (Signed)
Physical Therapy Treatment Patient Details Name: Angel Wolf MRN: [id number] DOB: [birth year] Today's Date: 03/24/2013 Time: 4098-1191 PT Time Calculation (min): 27 min  PT Assessment / Plan / Recommendation Comments on Treatment Session  pt progressing well with therapy. able to increase ambulation. Will cont to f/u with pt to maximize functional mobility prior to D/C home.    Follow Up Recommendations  Home health PT;Supervision/Assistance - 24 hour     Does the patient have the potential to tolerate intense rehabilitation     Barriers to Discharge        Equipment Recommendations  Rolling walker with 5" wheels;Other (comment)    Recommendations for Other Services    Frequency 7X/week   Plan Discharge plan remains appropriate;Frequency remains appropriate    Precautions / Restrictions Precautions Precautions: Fall Restrictions Weight Bearing Restrictions: Yes RLE Weight Bearing: Weight bearing as tolerated   Pertinent Vitals/Pain 8/10; pt repositioned in bed    Mobility  Bed Mobility Bed Mobility: Sit to Supine Sit to Supine: 4: Min assist;HOB flat;With rail Details for Bed Mobility Assistance: (A) to advance R LE onto bed; requires rails and increased time due to pain Transfers Transfers: Sit to Stand;Stand to Sit Sit to Stand: 4: Min guard;From chair/3-in-1;From toilet (x 2) Stand to Sit: 4: Min guard;To bed;With upper extremity assist Details for Transfer Assistance: verbal cues for hand placement and safety; pt required (A) to steady and come to upright position due to pain and posterior lean  Ambulation/Gait Ambulation/Gait Assistance: 4: Min guard Ambulation Distance (Feet): 12 Feet (x2) Assistive device: Rolling walker Ambulation/Gait Assistance Details: verbal cues for gt sequencing and safety with RW; pt requires increased time due to pain and decreased ability to WB through R LE Gait Pattern: Decreased stance time - right;Decreased step length -  left;Trunk flexed Gait velocity: decreased due to pain  Stairs: No Wheelchair Mobility Wheelchair Mobility: No    Exercises Total Joint Exercises Ankle Circles/Pumps: AROM;Both;10 reps Quad Sets: AROM;Right;10 reps;Supine Gluteal Sets: AROM;10 reps;Supine Heel Slides: AAROM;Right;10 reps;Supine   PT Diagnosis:    PT Problem List:   PT Treatment Interventions:     PT Goals Acute Rehab PT Goals PT Goal Formulation: With patient Time For Goal Achievement: 03/31/13 Potential to Achieve Goals: Good PT Goal: Sit to Supine/Side - Progress: Progressing toward goal PT Goal: Sit to Stand - Progress: Progressing toward goal PT Goal: Stand to Sit - Progress: Progressing toward goal PT Goal: Ambulate - Progress: Progressing toward goal PT Goal: Perform Home Exercise Program - Progress: Progressing toward goal  Visit Information  Last PT Received On: 03/24/13 Assistance Needed: +1    Subjective Data  Subjective: pt sitting in chair; eager to go back to chair after walk Patient Stated Goal: home with son and friend    Cognition  Cognition Arousal/Alertness: Awake/alert Behavior During Therapy: WFL for tasks assessed/performed Overall Cognitive Status: Within Functional Limits for tasks assessed    Balance  Balance Balance Assessed: No  End of Session PT - End of Session Equipment Utilized During Treatment: Gait belt Activity Tolerance: Patient tolerated treatment well Patient left: in bed;with call bell/phone within reach;with family/visitor present Nurse Communication: Mobility status   GP     Donell Sievert , [place] [phone]  03/24/2013, 1:38 PM

## 2013-03-24 NOTE — Evaluation (Signed)
Occupational Therapy Evaluation Patient Details Name: Angel Wolf MRN: [id number] DOB: 1951-01-16 Today's Date: 03/24/2013 Time: 4098-1191 OT Time Calculation (min): 19 min  OT Assessment / Plan / Recommendation Clinical Impression    Pt is a 62 y.o. female s/p R THA POD#1. Pt limited in therapy today due to pain and nausea with activity. Requires max encouragement to participate. Pt presents with below problem list. Will benefit from skilled OT to maximize independence prior to d/c. Will recommend HHOT upon acute D/C.     OT Assessment  Patient needs continued OT Services    Follow Up Recommendations  Home health OT;Supervision/Assistance - 24 hour    Barriers to Discharge      Equipment Recommendations  3 in 1 bedside comode    Recommendations for Other Services    Frequency  Min 2X/week    Precautions / Restrictions Precautions Precautions: Fall Precaution Comments: given handout on exercises for THA protocol Restrictions Weight Bearing Restrictions: Yes RLE Weight Bearing: Weight bearing as tolerated   Pertinent Vitas/Pain 8/10; premedicated.     ADL  Eating/Feeding: Independent Where Assessed - Eating/Feeding: Chair Grooming: Set up Where Assessed - Grooming: Supported sitting Upper Body Bathing: Set up Where Assessed - Upper Body Bathing: Supported sitting Lower Body Bathing: Moderate assistance Where Assessed - Lower Body Bathing: Supported sit to stand Upper Body Dressing: Set up Where Assessed - Upper Body Dressing: Supported sitting Lower Body Dressing: Moderate assistance Where Assessed - Lower Body Dressing: Supported sit to Pharmacist, hospital: Simulated;Moderate assistance Toilet Transfer Method: Stand pivot Toilet Transfer Equipment: Other (comment) (from elevated bed) Tub/Shower Transfer Method: Not assessed Equipment Used: Gait belt;Rolling walker Transfers/Ambulation Related to ADLs: Mod A for sit <> stand transfers and Stand pivot  transfer to chair. ADL Comments: Pt unable to reach feet while sitting in chair. Overall  Mod A for LB ADLs with sit to stand transfer.     OT Diagnosis: Acute pain  OT Problem List: Decreased strength;Decreased range of motion;Decreased activity tolerance;Impaired balance (sitting and/or standing);Decreased knowledge of use of DME or AE;Pain OT Treatment Interventions: Self-care/ADL training;DME and/or AE instruction;Therapeutic activities;Patient/family education;Balance training   OT Goals Acute Rehab OT Goals OT Goal Formulation: With patient Time For Goal Achievement: 03/31/13 Potential to Achieve Goals: Good ADL Goals Pt Will Perform Lower Body Bathing: with modified independence;Sit to stand from chair ADL Goal: Lower Body Bathing - Progress: Goal set today Pt Will Perform Lower Body Dressing: with modified independence;Sit to stand from bed;Sit to stand from chair ADL Goal: Lower Body Dressing - Progress: Goal set today Pt Will Transfer to Toilet: with modified independence;Ambulation;with DME ADL Goal: Toilet Transfer - Progress: Goal set today Pt Will Perform Toileting - Clothing Manipulation: with modified independence;Standing ADL Goal: Toileting - Clothing Manipulation - Progress: Goal set today Pt Will Perform Toileting - Hygiene: with modified independence;Sit to stand from 3-in-1/toilet;Sitting on 3-in-1 or toilet ADL Goal: Toileting - Hygiene - Progress: Goal set today Pt Will Perform Tub/Shower Transfer: Tub transfer;with supervision;with DME;Ambulation ADL Goal: Tub/Shower Transfer - Progress: Goal set today  Visit Information  Last OT Received On: 03/24/13 Assistance Needed: +1 PT/OT Co-Evaluation/Treatment: Yes    Subjective Data      Prior Functioning     Home Living Lives With: Son;Other (Comment) (friend) Available Help at Discharge: Family;Friend(s);Available 24 hours/day Type of Home: House Home Access: Stairs to enter [employer] of  Steps: 1 Entrance Stairs-Rails: None Home Layout: One level Bathroom Shower/Tub: Forensic scientist: Standard  Bathroom Accessibility: Yes How Accessible: Accessible via walker Home Adaptive Equipment: Straight cane Prior Function Level of Independence: Independent Able to Take Stairs?: Yes Driving: Yes Vocation: Full time employment Communication Communication: No difficulties Dominant Hand: Right         Vision/Perception     Cognition  Cognition Arousal/Alertness: Awake/alert Behavior During Therapy: WFL for tasks assessed/performed Overall Cognitive Status: Within Functional Limits for tasks assessed    Extremity/Trunk Assessment Right Upper Extremity Assessment RUE ROM/Strength/Tone: [facility] for tasks assessed Left Upper Extremity Assessment LUE ROM/Strength/Tone: WFL for tasks assessed     Mobility Bed Mobility Bed Mobility: Supine to Sit;Sitting - Scoot to Edge of Bed Supine to Sit: 4: Min assist;HOB elevated;With rails Sitting - Scoot to Edge of Bed: 4: Min guard;With rail Details for Bed Mobility Assistance: requires (A) to advance R LE to/off EOB; increased time due to pain; pt became nausous and began to vomit while sitting at EOB; deferred amb at this time; agreeable to SPT to chair  Transfers Transfers: Sit to Stand;Stand to Sit Sit to Stand: 3: Mod assist;From bed;From elevated surface Stand to Sit: 3: Mod assist;To chair/3-in-1;With armrests;With upper extremity assist Details for Transfer Assistance: verbal cues for hand placement and safety with RW;  pt requires (A) to steady when intially standing due to decreased ability to WB through R LE due to pain; denies dizziness      Exercise   Balance   End of Session OT - End of Session Equipment Utilized During Treatment: Gait belt Activity Tolerance: Patient limited by pain;Other (comment) (nauseaus) Patient left: in chair;with call bell/phone within reach;with family/visitor  present Nurse Communication: Other (comment) (pt vommited)  GO     Earlie Raveling OTR/L [phone] 03/24/2013, 9:40 AM

## 2013-03-24 NOTE — Progress Notes (Signed)
UR COMPLETED  

## 2013-03-24 NOTE — Care Management Note (Signed)
CARE MANAGEMENT NOTE 03/24/2013  Patient:  Angel Wolf, Angel Wolf   Account Number:  [id number]  Date Initiated:  03/24/2013  Documentation initiated by:  Vance Peper  Subjective/Objective Assessment:   62 yr old female s/p right total hip arthroplasty.     Action/Plan:   Patient preoperatively setup with [facility], no changes.   Anticipated DC Date:  03/25/2013   Anticipated DC Plan:  HOME W HOME HEALTH SERVICES      DC Planning Services  CM consult      PAC Choice  DURABLE MEDICAL EQUIPMENT  HOME HEALTH   Choice offered to / List presented to:  C-1 Patient   DME arranged  [facility]  3-N-1      DME agency  [employer]     HH arranged  HH-2 PT     HH   OT HH agency  [facility]   Status of service:  Completed, signed off Medicare Important Message given?   (If response is "NO", the following Medicare IM given date fields will be blank) Date Medicare IM given:   Date Additional Medicare IM given:    Discharge Disposition:    Per UR Regulation:    If discussed at Long Length of Stay Meetings, dates discussed:    Comments:

## 2013-03-24 NOTE — Discharge Summary (Signed)
Patient ID: Angel Wolf MRN: [id number] DOB/AGE: [birth year] 62 y.o.  Admit date: 03/23/2013 Discharge date:03/26/2013   Admission Diagnoses:  Principal Problem:   DJD (degenerative joint disease) of hip Active Problems:   Postoperative anemia due to acute blood loss   Discharge Diagnoses:  Same  Past Medical History  Diagnosis Date  . Hypertension   . Frequent urination   . Hyperlipemia   . History of anemia     Surgeries: Procedure(s): RIGHT TOTAL HIP ARTHROPLASTY ANTERIOR APPROACH on 03/23/2013   Discharged Condition: Improved  Hospital Course: Angel Wolf is an 62 y.o. female who was admitted 03/23/2013 for operative treatment ofDJD (degenerative joint disease) of hip. Patient has severe unremitting pain that affects sleep, daily activities, and work/hobbies. After pre-op clearance the patient was taken to the operating room on 03/23/2013 and underwent  Procedure(s): RIGHT TOTAL HIP ARTHROPLASTY ANTERIOR APPROACH.    Patient was given perioperative antibiotics:     Anti-infectives   Start     Dose/Rate Route Frequency Ordered Stop   03/23/13 2100  ceFAZolin (ANCEF) IVPB 2 g/50 mL premix     2 g 100 mL/hr over 30 Minutes Intravenous Every 6 hours 03/23/13 1934 03/24/13 0419   03/23/13 0738  ceFAZolin (ANCEF) 2-3 GM-% IVPB SOLR    Comments:  Kathrene Bongo: cabinet override      03/23/13 0738 03/23/13 1944   03/23/13 0600  ceFAZolin (ANCEF) IVPB 2 g/50 mL premix     2 g 100 mL/hr over 30 Minutes Intravenous On call to O.R. 03/22/13 1414 03/23/13 1013       Patient was given sequential compression devices, early ambulation, and chemoprophylaxis to prevent DVT.She had decreased hgb, treated with oral iron and close monitoring.  Patient benefited maximally from hospital stay and there were no complications.    Recent vital signs:  Patient Vitals for the past 24 hrs:  BP Temp Pulse Resp SpO2  03/25/13 2108 126/52 mmHg 98.6 F (37 C) 71 18 98 %      Recent laboratory studies:   Recent Labs  03/24/13 0418 03/25/13 0500 03/26/13 0528  WBC 9.0 10.8* 11.7*  HGB 9.0* 8.4* 8.2*  HCT 26.2* 25.1* 24.5*  PLT 295 288 294  NA 134*  --   --   K 4.2  --   --   CL 97  --   --   CO2 28  --   --   BUN 12  --   --   CREATININE 0.96  --   --   GLUCOSE 114*  --   --   CALCIUM 9.0  --   --      Discharge Medications:     Medication List    STOP taking these medications       HYDROcodone-acetaminophen 5-325 MG per tablet  Commonly known as:  NORCO/VICODIN      TAKE these medications       aspirin 325 MG EC tablet  Take 1 tablet (325 mg total) by mouth 2 (two) times daily.     ferrous sulfate 325 (65 FE) MG tablet  Take 1 tablet (325 mg total) by mouth daily with breakfast.     HYDROcodone-acetaminophen 7.5-325 MG per tablet  Commonly known as:  NORCO  Take 1-2 tablets by mouth every 4 (four) hours as needed.     losartan-hydrochlorothiazide 100-12.5 MG per tablet  Commonly known as:  HYZAAR  Take 1 tablet by mouth daily.     methocarbamol 500  MG tablet  Commonly known as:  ROBAXIN  Take 1 tablet (500 mg total) by mouth every 6 (six) hours as needed.     oxybutynin 5 MG tablet  Commonly known as:  DITROPAN  Take 5 mg by mouth daily.     simvastatin 20 MG tablet  Commonly known as:  ZOCOR  Take 20 mg by mouth at bedtime.     Vitamin D (Ergocalciferol) 50000 UNITS Caps  Commonly known as:  DRISDOL  Take 50,000 Units by mouth every 7 (seven) days. On thursdays each week        Diagnostic Studies: Dg Chest 2 View  03/16/2013   *RADIOLOGY REPORT*  Clinical Data: Preop  CHEST - 2 VIEW  Comparison: None.  Findings: Cardiomediastinal silhouette is unremarkable.  No acute infiltrate or pleural effusion.  No pulmonary edema.  Mild degenerative changes thoracic spine.  IMPRESSION: No active disease.   Original Report Authenticated By: Natasha Mead, M.D.   Dg Hip Operative Right  03/23/2013   *RADIOLOGY REPORT*  Clinical  Data: Degenerative joint disease  DG OPERATIVE RIGHT HIP  Comparison: None.  Findings: Right total hip arthroplasty is in place.  Anatomic alignment of the osseous and prosthetic structures.  No breakage or loosening of the hardware.  IMPRESSION: Right total hip arthroplasty anatomically aligned.   Original Report Authenticated By: Jolaine Click, M.D.    Disposition: 01-Home or Self Care  Discharge Orders   Future Orders Complete By Expires     Call MD / Call 911  As directed     Comments:      If you experience chest pain or shortness of breath, CALL 911 and be transported to the hospital emergency room.  If you develope a fever above 101 F, pus (white drainage) or increased drainage or redness at the wound, or calf pain, call your surgeon's office.    Diet general  As directed     Increase activity slowly as tolerated  As directed     Weight bearing as tolerated  As directed        Follow-up Information   Follow up with DALLDORF,PETER G, MD. Call in 2 weeks.   Contact information:   [address] [place] [address] [phone]        Signed: Matthew Folks 03/26/2013, 6:10 PM

## 2013-03-24 NOTE — Progress Notes (Signed)
Subjective: 1 Day Post-Op Procedure(s) (LRB): RIGHT TOTAL HIP ARTHROPLASTY ANTERIOR APPROACH (Right)  Activity level:  wbat Diet tolerance:  ok Voiding:  ok Patient reports pain as 3 on 0-10 scale.    Objective: Vital signs in last 24 hours: Temp:  [96 F (35.6 C)-98.9 F (37.2 C)] 98.9 F (37.2 C) (05/30 0500) Pulse Rate:  [52-91] 83 (05/30 0500) Resp:  [8-20] 16 (05/30 0500) BP: (122-195)/(51-121) 130/54 mmHg (05/30 0500) SpO2:  [98 %-100 %] 100 % (05/30 0500)  Labs:  Recent Labs  03/24/13 0418  HGB 9.0*    Recent Labs  03/24/13 0418  WBC 9.0  RBC 2.79*  HCT 26.2*  PLT 295    Recent Labs  03/24/13 0418  NA 134*  K 4.2  CL 97  CO2 28  BUN 12  CREATININE 0.96  GLUCOSE 114*  CALCIUM 9.0   No results found for this basename: LABPT, INR,  in the last 72 hours  Physical Exam:  Neurologically intact ABD soft Neurovascular intact Sensation intact distally Intact pulses distally Dorsiflexion/Plantar flexion intact Incision: dressing C/D/I No cellulitis present Compartment soft  Assessment/Plan:  1 Day Post-Op Procedure(s) (LRB): RIGHT TOTAL HIP ARTHROPLASTY ANTERIOR APPROACH (Right) Advance diet Up with therapy D/C IV fluids Discharge home with home health sat/ sun Asa 325 bid x 4 weeks  scd's wbat D/c iv hgb sat    Angel Wolf R 03/24/2013, 8:14 AM

## 2013-03-25 LAB — CBC
HCT: 25.1 % — ABNORMAL LOW (ref 36.0–46.0)
Hemoglobin: 8.4 g/dL — ABNORMAL LOW (ref 12.0–15.0)
MCH: 31.6 pg (ref 26.0–34.0)
MCHC: 33.5 g/dL (ref 30.0–36.0)
RBC: 2.66 MIL/uL — ABNORMAL LOW (ref 3.87–5.11)

## 2013-03-25 MED ORDER — FERROUS SULFATE 325 (65 FE) MG PO TABS
325.0000 mg | ORAL_TABLET | Freq: Three times a day (TID) | ORAL | Status: DC
  Administered 2013-03-26: 325 mg via ORAL
  Filled 2013-03-25 (×6): qty 1

## 2013-03-25 NOTE — Progress Notes (Signed)
Occupational Therapy Treatment Patient Details Name: Angel Wolf MRN: [id number] DOB: 1950/11/21 Today's Date: 03/25/2013 Time: 0865-7846 OT Time Calculation (min): 25 min  OT Assessment / Plan / Recommendation Comments on Treatment Session Pt limited due to feeling nauseaus during session. Educated on AE and practiced with reacher and sockaid as well as performed toilet transfer.     Follow Up Recommendations  Home health OT;Supervision/Assistance - 24 hour    Barriers to Discharge       Equipment Recommendations  3 in 1 bedside comode    Recommendations for Other Services    Frequency Min 2X/week   Plan Discharge plan remains appropriate    Precautions / Restrictions Precautions Precautions: Fall Restrictions Weight Bearing Restrictions: Yes RLE Weight Bearing: Weight bearing as tolerated   Pertinent Vitals/Pain Pain 9/10. Pt feeling nauseous during session. Did not want any meds. Repositioned.      ADL  Grooming: Performed;Wash/dry hands;Min guard Where Assessed - Grooming: Unsupported standing Lower Body Dressing: Performed;Set up Where Assessed - Lower Body Dressing: Unsupported sitting Toilet Transfer: Performed;Min guard Toilet Transfer Method: Sit to Barista: Raised toilet seat with arms (or 3-in-1 over toilet) Toileting - Clothing Manipulation and Hygiene: Performed;Min guard Where Assessed - Engineer, mining and Hygiene: Sit to stand from 3-in-1 or toilet Equipment Used: Gait belt;Rolling walker;Sock aid;Reacher;Long-handled sponge;Long-handled shoe horn Transfers/Ambulation Related to ADLs: Minguard  ADL Comments: Pt performed toilet transfer at [employer] assist and clothing manipulation at minguard assist (no hygiene performed due to not being able to void). Pt practiced with AE and was educated on AE that comes in hip kit. Pt donned/doffed sock with reacher and sockaid. Pt limited due to nausea.    OT Diagnosis:     OT Problem List:   OT Treatment Interventions:     OT Goals Acute Rehab OT Goals OT Goal Formulation: With patient Time For Goal Achievement: 03/31/13 Potential to Achieve Goals: Good ADL Goals Pt Will Perform Lower Body Bathing: with modified independence;Sit to stand from chair Pt Will Perform Lower Body Dressing: with modified independence;Sit to stand from bed;Sit to stand from chair ADL Goal: Lower Body Dressing - Progress: Progressing toward goals Pt Will Transfer to Toilet: with modified independence;Ambulation;with DME ADL Goal: Toilet Transfer - Progress: Progressing toward goals Pt Will Perform Toileting - Clothing Manipulation: with modified independence;Standing ADL Goal: Toileting - Clothing Manipulation - Progress: Progressing toward goals Pt Will Perform Toileting - Hygiene: with modified independence;Sit to stand from 3-in-1/toilet;Sitting on 3-in-1 or toilet Pt Will Perform Tub/Shower Transfer: Tub transfer;with supervision;with DME;Ambulation  Visit Information  Last OT Received On: 03/25/13 Assistance Needed: +1    Subjective Data      Prior Functioning       Cognition  Cognition Arousal/Alertness: Awake/alert Behavior During Therapy: WFL for tasks assessed/performed Overall Cognitive Status: Within Functional Limits for tasks assessed    Mobility  Bed Mobility Bed Mobility: Supine to Sit;Sitting - Scoot to Edge of Bed Supine to Sit: HOB flat;4: Min guard Sitting - Scoot to Edge of Bed: 5: Supervision Sit to Supine: 4: Min assist;HOB flat Details for Bed Mobility Assistance: No physical assist needed. Pt taking increased time.  Transfers Transfers: Sit to Stand;Stand to Sit Sit to Stand: 4: Min guard;With upper extremity assist;From bed;From chair/3-in-1 Stand to Sit: 4: Min guard;With upper extremity assist;To chair/3-in-1 Details for Transfer Assistance: cues for hand placement and sequencing.    Exercises      Balance     End of  Session OT  - End of Session Equipment Utilized During Treatment: Gait belt Activity Tolerance: Other (comment) (nauseaus) Patient left: in chair;with call bell/phone within reach;with family/visitor present Nurse Communication: Mobility status;Other (comment) (nausea and pain level)  GO     Earlie Raveling OTR/L [phone] 03/25/2013, 3:51 PM

## 2013-03-25 NOTE — Progress Notes (Addendum)
Subjective: 2 Days Post-Op Procedure(s) (LRB): RIGHT TOTAL HIP ARTHROPLASTY ANTERIOR APPROACH (Right) Patient reports pain as 4 on 0-10 scale. Wants to wait until tomorrow to go home.  Feels unsteady when up.  Some dizziness.    Objective: Vital signs in last 24 hours: Temp:  [98.9 F (37.2 C)-101.4 F (38.6 C)] 99.2 F (37.3 C) (05/31 0534) Pulse Rate:  [78-84] 80 (05/31 0534) Resp:  [16] 16 (05/31 0534) BP: (97-126)/(44-60) 118/60 mmHg (05/31 0534) SpO2:  [95 %-98 %] 96 % (05/31 0534)  Intake/Output from previous day: 05/30 0701 - 05/31 0700 In: 980 [P.O.:480] Out: 200 [Urine:200] Intake/Output this shift:     Recent Labs  03/23/13 1137 03/24/13 0418 03/25/13 0500  HGB 9.9* 9.0* 8.4*    Recent Labs  03/24/13 0418 03/25/13 0500  WBC 9.0 10.8*  RBC 2.79* 2.66*  HCT 26.2* 25.1*  PLT 295 288    Recent Labs  03/23/13 1137 03/24/13 0418  NA 139 134*  K 4.0 4.2  CL  --  97  CO2  --  28  BUN  --  12  CREATININE  --  0.96  GLUCOSE 113* 114*  CALCIUM  --  9.0  right hip exam:  Neurovascular intact Sensation intact distally Intact pulses distally Dorsiflexion/Plantar flexion intact Incision: dressing C/D/I Compartment soft  Assessment/Plan: 2 Days Post-Op Procedure(s) (LRB): RIGHT TOTAL HIP ARTHROPLASTY ANTERIOR APPROACH (Right) Acute blood loss anemia Plan: Check CBC in a.m. Continue on oral iron.Increase frequency to TID. Plan for discharge tomorrow  Angel Wolf 03/25/2013, 9:13 AM

## 2013-03-25 NOTE — Progress Notes (Signed)
Physical Therapy Treatment Patient Details Name: Angel Wolf MRN: [id number] DOB: 1951-06-30 Today's Date: 03/25/2013 Time: 8295-6213 PT Time Calculation (min): 24 min  PT Assessment / Plan / Recommendation Comments on Treatment Session  Pt cont's to make steady progress with mobility but ambulation distance was limited due to pt reports dizziness as 8/10.  RN was made aware.      Follow Up Recommendations  Home health PT;Supervision/Assistance - 24 hour     Does the patient have the potential to tolerate intense rehabilitation     Barriers to Discharge        Equipment Recommendations  Rolling walker with 5" wheels;Other (comment)    Recommendations for Other Services    Frequency 7X/week   Plan Discharge plan remains appropriate;Frequency remains appropriate    Precautions / Restrictions Precautions Precautions: Fall Restrictions Weight Bearing Restrictions: Yes RLE Weight Bearing: Weight bearing as tolerated   Pertinent Vitals/Pain Pt denies pain.  Reports dizziness as 8/10.      Mobility  Bed Mobility Bed Mobility: Supine to Sit;Sitting - Scoot to Edge of Bed Supine to Sit: 4: Min assist Sitting - Scoot to Edge of Bed: 5: Supervision Details for Bed Mobility Assistance: Cues for sequencing & technique.  Minor (A) for R LE.  Instructed pt on hooking technique to have LLE assist with moving RLE.   Transfers Transfers: Sit to Stand;Stand to Sit Sit to Stand: 4: Min guard;With upper extremity assist;With armrests;From bed;From chair/3-in-1 Stand to Sit: 4: Min guard;With upper extremity assist;With armrests;To chair/3-in-1 Details for Transfer Assistance: Cues for hand placement & technique.   Ambulation/Gait Ambulation/Gait Assistance: 4: Min guard Ambulation Distance (Feet): 25 Feet Assistive device: Rolling walker Ambulation/Gait Assistance Details: Distance limited due to pt c/o dizziness.  Cues for sequencing & safe use of RW.   Gait Pattern: Decreased  step length - left;Decreased step length - right;Decreased stance time - right;Step-to pattern;Decreased hip/knee flexion - right;Decreased weight shift to right General Gait Details: pt rated dizziness as 8/10.  RN notified.   Stairs: No Wheelchair Mobility Wheelchair Mobility: No    Exercises Total Joint Exercises Ankle Circles/Pumps: AROM;Both;10 reps Quad Sets: AROM;Both;10 reps Gluteal Sets: AROM;Both;10 reps Heel Slides: AAROM;Right;10 reps;Strengthening Hip ABduction/ADduction: AAROM;Strengthening;Right;10 reps     PT Goals Acute Rehab PT Goals Time For Goal Achievement: 03/31/13 Potential to Achieve Goals: Good Pt will go Supine/Side to Sit: with modified independence PT Goal: Supine/Side to Sit - Progress: Progressing toward goal Pt will go Sit to Supine/Side: with modified independence Pt will go Sit to Stand: with modified independence PT Goal: Sit to Stand - Progress: Progressing toward goal Pt will go Stand to Sit: with modified independence PT Goal: Stand to Sit - Progress: Progressing toward goal Pt will Ambulate: >150 feet;with modified independence;with rolling walker PT Goal: Ambulate - Progress: Progressing toward goal Pt will Go Up / Down Stairs: 1-2 stairs;with rolling walker;with supervision Pt will Perform Home Exercise Program: Independently PT Goal: Perform Home Exercise Program - Progress: Progressing toward goal  Visit Information  Last PT Received On: 03/25/13 Assistance Needed: +1    Subjective Data      Cognition  Cognition Arousal/Alertness: Awake/alert Behavior During Therapy: WFL for tasks assessed/performed Overall Cognitive Status: Within Functional Limits for tasks assessed    Balance     End of Session PT - End of Session Equipment Utilized During Treatment: Gait belt Activity Tolerance: Patient tolerated treatment well Patient left: in chair;with call bell/phone within reach;with family/visitor present Nurse Communication:  Mobility status (pt's dizziness)     Verdell Face, [place] [phone] 03/25/2013

## 2013-03-25 NOTE — Progress Notes (Signed)
Physical Therapy Treatment Patient Details Name: Angel Wolf MRN: [id number] DOB: 08-May-1951 Today's Date: 03/25/2013 Time: 1340-1405 PT Time Calculation (min): 25 min  PT Assessment / Plan / Recommendation Comments on Treatment Session  Pt able to increase ambulation distance this afternoon.  When heading back into room she reported feeling nauseaus & then became actively sick.  RN was notified.      Follow Up Recommendations  Home health PT;Supervision/Assistance - 24 hour     Does the patient have the potential to tolerate intense rehabilitation     Barriers to Discharge        Equipment Recommendations  Rolling walker with 5" wheels;Other (comment)    Recommendations for Other Services    Frequency 7X/week   Plan Discharge plan remains appropriate;Frequency remains appropriate    Precautions / Restrictions Precautions Precautions: Fall Restrictions RLE Weight Bearing: Weight bearing as tolerated       Mobility  Bed Mobility Bed Mobility: Sit to Supine;Sit to Sidelying Right Sit to Supine: 4: Min assist;HOB flat Details for Bed Mobility Assistance: (A) for LE's back into bed Transfers Transfers: Sit to Stand;Stand to Sit Sit to Stand: 4: Min guard;With upper extremity assist;With armrests;From chair/3-in-1 Stand to Sit: 5: Supervision;With upper extremity assist;With armrests;To chair/3-in-1;To bed Ambulation/Gait Ambulation/Gait Assistance: 5: Supervision Ambulation Distance (Feet): 40 Feet Assistive device: Rolling walker Ambulation/Gait Assistance Details: cues for sequencing, increased step length, increased weight shifting to RLE during LLE swing phase.   Gait Pattern: Step-to pattern;Decreased step length - right;Decreased step length - left;Decreased stance time - right;Decreased hip/knee flexion - right;Decreased weight shift to right;Antalgic Stairs: No Wheelchair Mobility Wheelchair Mobility: No     PT Goals Acute Rehab PT Goals Time For  Goal Achievement: 03/31/13 Potential to Achieve Goals: Good Pt will go Supine/Side to Sit: with modified independence Pt will go Sit to Supine/Side: with modified independence PT Goal: Sit to Supine/Side - Progress: Progressing toward goal Pt will go Sit to Stand: with modified independence PT Goal: Sit to Stand - Progress: Progressing toward goal Pt will go Stand to Sit: with modified independence PT Goal: Stand to Sit - Progress: Progressing toward goal Pt will Ambulate: >150 feet;with modified independence;with rolling walker PT Goal: Ambulate - Progress: Progressing toward goal Pt will Go Up / Down Stairs: 1-2 stairs;with rolling walker;with supervision Pt will Perform Home Exercise Program: Independently  Visit Information  Last PT Received On: 03/25/13 Assistance Needed: +1    Subjective Data      Cognition  Cognition Arousal/Alertness: Awake/alert Behavior During Therapy: WFL for tasks assessed/performed Overall Cognitive Status: Within Functional Limits for tasks assessed    Balance     End of Session PT - End of Session Equipment Utilized During Treatment: Gait belt Activity Tolerance: Patient limited by fatigue (pt with vomiting when headed back to bed.  ) Patient left: in bed;with call bell/phone within reach;with nursing in room Nurse Communication: Mobility status;Other (comment) (pt vomiting)     Verdell Face, PTA [phone] 03/25/2013

## 2013-03-26 DIAGNOSIS — D62 Acute posthemorrhagic anemia: Secondary | ICD-10-CM | POA: Diagnosis not present

## 2013-03-26 LAB — CBC
Hemoglobin: 8.2 g/dL — ABNORMAL LOW (ref 12.0–15.0)
MCHC: 33.5 g/dL (ref 30.0–36.0)
RDW: 12.7 % (ref 11.5–15.5)
WBC: 11.7 10*3/uL — ABNORMAL HIGH (ref 4.0–10.5)

## 2013-03-26 MED ORDER — BISACODYL 10 MG RE SUPP
10.0000 mg | Freq: Once | RECTAL | Status: AC
  Administered 2013-03-26: 10 mg via RECTAL

## 2013-03-26 NOTE — Progress Notes (Signed)
Physical Therapy Treatment Patient Details Name: Angel Wolf MRN: [id number] DOB: [birth year] Today's Date: 03/26/2013 Time: 4098-1191 PT Time Calculation (min): 24 min  PT Assessment / Plan / Recommendation Comments on Treatment Session  Pt making steady progress with mobility.  Increased ambulation distance & performed stairs.  Pt mobilizing well enough to safely d/c home when MD feels appropriate.      Follow Up Recommendations  Home health PT;Supervision/Assistance - 24 hour     Does the patient have the potential to tolerate intense rehabilitation     Barriers to Discharge        Equipment Recommendations  Rolling walker with 5" wheels;Other (comment)    Recommendations for Other Services    Frequency 7X/week   Plan Discharge plan remains appropriate;Frequency remains appropriate    Precautions / Restrictions Precautions Precautions: Fall Restrictions Weight Bearing Restrictions: Yes RLE Weight Bearing: Weight bearing as tolerated   Pertinent Vitals/Pain 7/10 Rt hip.  Declines RN notification for pain meds.     Mobility  Bed Mobility Bed Mobility: Not assessed Transfers Transfers: Sit to Stand;Stand to Sit Sit to Stand: 5: Supervision;With upper extremity assist;With armrests;From chair/3-in-1 Stand to Sit: 4: Min guard;With upper extremity assist;With armrests;To chair/3-in-1 Details for Transfer Assistance: Cues for controlled descent into recliner.   Ambulation/Gait Ambulation/Gait Assistance: 5: Supervision Ambulation Distance (Feet): 100 Feet Assistive device: Rolling walker Ambulation/Gait Assistance Details: Cues for decreased reliance of UE's on RW, increased Rt hip/knee flexion, increased step/stride length, & safety with turns.     Gait Pattern: Step-to pattern;Step-through pattern;Decreased step length - left;Decreased weight shift to right;Decreased hip/knee flexion - right General Gait Details: Pt beginning to progress to step-through gait  pattern.   Stairs: Yes Stairs Assistance: 4: Min assist Stairs Assistance Details (indicate cue type and reason): Cues for sequencing & technique.  (A) for balance & RW management.  Pt's boyfriend present & assisted pt with 2nd trial.   Stair Management Technique: No rails;Backwards;With walker Number of Stairs: 2 (2x's) Wheelchair Mobility Wheelchair Mobility: No      PT Goals Acute Rehab PT Goals Time For Goal Achievement: 03/31/13 Potential to Achieve Goals: Good Pt will go Supine/Side to Sit: with modified independence Pt will go Sit to Supine/Side: with modified independence Pt will go Sit to Stand: with modified independence PT Goal: Sit to Stand - Progress: Progressing toward goal Pt will go Stand to Sit: with modified independence PT Goal: Stand to Sit - Progress: Progressing toward goal Pt will Ambulate: >150 feet;with modified independence;with rolling walker PT Goal: Ambulate - Progress: Progressing toward goal Pt will Go Up / Down Stairs: 1-2 stairs;with rolling walker;with supervision PT Goal: Up/Down Stairs - Progress: Progressing toward goal Pt will Perform Home Exercise Program: Independently  Visit Information  Last PT Received On: 03/26/13 Assistance Needed: +1    Subjective Data      Cognition  Cognition Arousal/Alertness: Awake/alert Behavior During Therapy: WFL for tasks assessed/performed Overall Cognitive Status: Within Functional Limits for tasks assessed    Balance     End of Session PT - End of Session Equipment Utilized During Treatment: Gait belt Activity Tolerance: Patient tolerated treatment well Patient left: in chair;with call bell/phone within reach;with family/visitor present Nurse Communication: Mobility status     Verdell Face, [place] [phone] 03/26/2013

## 2013-03-26 NOTE — Progress Notes (Addendum)
Subjective: 3 Days Post-Op Procedure(s) (LRB): RIGHT TOTAL HIP ARTHROPLASTY ANTERIOR APPROACH (Right) Patient reports pain as 3 on 0-10 scale.   Some nausea   May be pain med related. No dizziness. Objective: Vital signs in last 24 hours: Temp:  [98.6 F (37 C)] 98.6 F (37 C) (05/31 2108) Pulse Rate:  [71-75] 71 (05/31 2108) Resp:  [18] 18 (05/31 2108) BP: (109-126)/(48-52) 126/52 mmHg (05/31 2108) SpO2:  [96 %-98 %] 98 % (05/31 2108)  Intake/Output from previous day: 05/31 0701 - 06/01 0700 In: 720 [P.O.:720] Out: -  Intake/Output this shift:     Recent Labs  03/23/13 1137 03/24/13 0418 03/25/13 0500 03/26/13 0528  HGB 9.9* 9.0* 8.4* 8.2*    Recent Labs  03/25/13 0500 03/26/13 0528  WBC 10.8* 11.7*  RBC 2.66* 2.60*  HCT 25.1* 24.5*  PLT 288 294    Recent Labs  03/23/13 1137 03/24/13 0418  NA 139 134*  K 4.0 4.2  CL  --  97  CO2  --  28  BUN  --  12  CREATININE  --  0.96  GLUCOSE 113* 114*  CALCIUM  --  9.0   No results found for this basename: LABPT, INR,  in the last 72 hours Right hip: Neurovascular intact Sensation intact distally Intact pulses distally Dorsiflexion/Plantar flexion intact Incision: dressing C/D/I Compartment soft  Assessment/Plan: 3 Days Post-Op Procedure(s) (LRB): RIGHT TOTAL HIP ARTHROPLASTY ANTERIOR APPROACH (Right) ABLA Plan: Discharge home with home health Send home on oral iron.  Curran Lenderman G 03/26/2013, 9:41 AM

## 2013-03-26 NOTE — Plan of Care (Signed)
Problem: Consults Goal: Diagnosis- Total Joint Replacement Primary Total Hip     

## 2013-03-28 LAB — TYPE AND SCREEN
ABO/RH(D): A POS
Antibody Screen: POSITIVE
DAT, IgG: NEGATIVE
PT AG Type: NEGATIVE
Unit division: 0
Unit division: 0

## 2013-04-04 NOTE — Progress Notes (Signed)
PT is recommending home with HH and not SNF.  Clinical Social Worker will sign off for now as social work intervention is no longer needed. Please consult us again if new need arises.   Jonna Dittrich, MSW [phone] 

## 2015-01-04 ENCOUNTER — Encounter (HOSPITAL_COMMUNITY): Payer: Self-pay

## 2015-01-04 ENCOUNTER — Emergency Department (HOSPITAL_COMMUNITY)
Admission: EM | Admit: 2015-01-04 | Discharge: 2015-01-04 | Disposition: A | Discharge status: Home / Self Care | Payer: BLUE CROSS/BLUE SHIELD | Attending: Emergency Medicine | Admitting: Emergency Medicine

## 2015-01-04 ENCOUNTER — Emergency Department (HOSPITAL_COMMUNITY): Payer: BLUE CROSS/BLUE SHIELD

## 2015-01-04 DIAGNOSIS — R569 Unspecified convulsions: Secondary | ICD-10-CM | POA: Diagnosis present

## 2015-01-04 DIAGNOSIS — Z7982 Long term (current) use of aspirin: Secondary | ICD-10-CM | POA: Diagnosis not present

## 2015-01-04 DIAGNOSIS — D649 Anemia, unspecified: Secondary | ICD-10-CM | POA: Diagnosis not present

## 2015-01-04 DIAGNOSIS — I10 Essential (primary) hypertension: Secondary | ICD-10-CM | POA: Insufficient documentation

## 2015-01-04 DIAGNOSIS — E785 Hyperlipidemia, unspecified: Secondary | ICD-10-CM | POA: Diagnosis not present

## 2015-01-04 DIAGNOSIS — Z79899 Other long term (current) drug therapy: Secondary | ICD-10-CM | POA: Diagnosis not present

## 2015-01-04 DIAGNOSIS — R079 Chest pain, unspecified: Secondary | ICD-10-CM | POA: Diagnosis not present

## 2015-01-04 LAB — I-STAT TROPONIN, ED: TROPONIN I, POC: 0 ng/mL (ref 0.00–0.08)

## 2015-01-04 LAB — CBC WITH DIFFERENTIAL/PLATELET
BASOS ABS: 0 10*3/uL (ref 0.0–0.1)
BASOS PCT: 0 % (ref 0–1)
EOS ABS: 0 10*3/uL (ref 0.0–0.7)
Eosinophils Relative: 0 % (ref 0–5)
HEMATOCRIT: 35.5 % — AB (ref 36.0–46.0)
Hemoglobin: 11.8 g/dL — ABNORMAL LOW (ref 12.0–15.0)
Lymphocytes Relative: 27 % (ref 12–46)
Lymphs Abs: 2 10*3/uL (ref 0.7–4.0)
MCH: 31.3 pg (ref 26.0–34.0)
MCHC: 33.2 g/dL (ref 30.0–36.0)
MCV: 94.2 fL (ref 78.0–100.0)
MONOS PCT: 5 % (ref 3–12)
Monocytes Absolute: 0.3 10*3/uL (ref 0.1–1.0)
Neutro Abs: 5 10*3/uL (ref 1.7–7.7)
Neutrophils Relative %: 68 % (ref 43–77)
Platelets: 319 10*3/uL (ref 150–400)
RBC: 3.77 MIL/uL — ABNORMAL LOW (ref 3.87–5.11)
RDW: 13.1 % (ref 11.5–15.5)
WBC: 7.3 10*3/uL (ref 4.0–10.5)

## 2015-01-04 LAB — BASIC METABOLIC PANEL
ANION GAP: 8 (ref 5–15)
BUN: 14 mg/dL (ref 6–23)
CHLORIDE: 106 mmol/L (ref 96–112)
CO2: 24 mmol/L (ref 19–32)
Calcium: 9.4 mg/dL (ref 8.4–10.5)
Creatinine, Ser: 1.02 mg/dL (ref 0.50–1.10)
GFR, EST AFRICAN AMERICAN: 67 mL/min — AB (ref >90)
GFR, EST NON AFRICAN AMERICAN: 58 mL/min — AB (ref >90)
Glucose, Bld: 136 mg/dL — ABNORMAL HIGH (ref 70–99)
POTASSIUM: 3.4 mmol/L — AB (ref 3.5–5.1)
SODIUM: 138 mmol/L (ref 135–145)

## 2015-01-04 LAB — CBG MONITORING, ED: Glucose-Capillary: 124 mg/dL — ABNORMAL HIGH (ref 70–99)

## 2015-01-04 NOTE — ED Notes (Signed)
Patient given discharge instruction, verbalized understand. Patient ambulatory out of the department.  

## 2015-01-04 NOTE — Discharge Instructions (Signed)
Please be aware you may have another seizure  Do not drive until seen by your physician for your condition  Do not climb ladders/roofs/trees as a seizure can occur at that height and cause serious harm  Do not bathe/swim alone as a seizure can occur and cause serious harm  Please followup with your physician or neurologist for further testing and possible treatment    Your caregiver has diagnosed you as having chest pain that is not specific for one problem, but does not require admission.  Chest pain comes from many different causes.  SEEK IMMEDIATE MEDICAL ATTENTION IF: You have severe chest pain, especially if the pain is crushing or pressure-like and spreads to the arms, back, neck, or jaw, or if you have sweating, nausea (feeling sick to your stomach), or shortness of breath. THIS IS AN EMERGENCY. Don't wait to see if the pain will go away. Get medical help at once. Call 911 or 0 (operator). DO NOT drive yourself to the hospital.  Your chest pain gets worse and does not go away with rest.  You have an attack of chest pain lasting longer than usual, despite rest and treatment with the medications your caregiver has prescribed.  You wake from sleep with chest pain or shortness of breath.  You feel dizzy or faint.  You have chest pain not typical of your usual pain for which you originally saw your caregiver.

## 2015-01-04 NOTE — ED Notes (Signed)
Per friend pt does have a " little seizures " and she takes medication for them

## 2015-01-04 NOTE — ED Provider Notes (Signed)
CSN: [id number]     Arrival date & time 01/04/15  1515 History  This chart was scribed for Zadie Rhine, MD by Abel Presto, ED Scribe. This patient was seen in room APA06/APA06 and the patient's care was started at 3:42 PM.      Chief Complaint  Patient presents with  . Seizures    LEVEL 5 CAVEAT- ALTERED MENTAL STATUS  Patient is a 64 y.o. female presenting with seizures. The history is provided by medical records. The history is limited by the condition of the patient. No language interpreter was used.  Seizures   HPI Comments: Angel Wolf is a 64 y.o. female who presents to the Emergency Department complaining of possible seizure less than 30 minutes ago. Pt was here to visit family member. Pt was found lying on floor in front of elevator by hospital staff. Pt indicating chest pain, rubbing chest in exam room.  Pt is awake and mumbling. No other details are known at arrival due to patient condition/altered mental status  Past Medical History  Diagnosis Date  . Hypertension   . Frequent urination   . Hyperlipemia   . History of anemia    Past Surgical History  Procedure Laterality Date  . Abdominal hysterectomy    . Total hip arthroplasty Right 03/23/2013    Procedure: RIGHT TOTAL HIP ARTHROPLASTY ANTERIOR APPROACH;  Surgeon: Velna Ochs, MD;  Location: [facility] OR;  Service: Orthopedics;  Laterality: Right;   No family history on file. History  Substance Use Topics  . Smoking status: Never Smoker   . Smokeless tobacco: Not on file  . Alcohol Use: Yes     Comment: Rare on special occasions   OB History    No data available     Review of Systems  Unable to perform ROS: Mental status change  Neurological: Positive for seizures.      Allergies  Review of patient's allergies indicates no known allergies.  Home Medications   Prior to Admission medications   Medication Sig Start Date End Date Taking? Authorizing Provider  aspirin EC 325 MG EC tablet  Take 1 tablet (325 mg total) by mouth 2 (two) times daily. 03/24/13   Lindwood Qua, PA-C  ferrous sulfate 325 (65 FE) MG tablet Take 1 tablet (325 mg total) by mouth daily with breakfast. 03/24/13   Lindwood Qua, PA-C  HYDROcodone-acetaminophen (NORCO) 7.5-325 MG per tablet Take 1-2 tablets by mouth every 4 (four) hours as needed. 03/24/13   Lindwood Qua, PA-C  losartan-hydrochlorothiazide (HYZAAR) 100-12.5 MG per tablet Take 1 tablet by mouth daily.    Historical Provider, MD  methocarbamol (ROBAXIN) 500 MG tablet Take 1 tablet (500 mg total) by mouth every 6 (six) hours as needed. 03/24/13   Lindwood Qua, PA-C  oxybutynin (DITROPAN) 5 MG tablet Take 5 mg by mouth daily.    Historical Provider, MD  simvastatin (ZOCOR) 20 MG tablet Take 20 mg by mouth at bedtime.    Historical Provider, MD  Vitamin D, Ergocalciferol, (DRISDOL) 50000 UNITS CAPS Take 50,000 Units by mouth every 7 (seven) days. On thursdays each week    Historical Provider, MD   BP 153/114 mmHg  Pulse 85  Temp(Src) 98.7 F (37.1 C) (Rectal)  Resp 22  SpO2 100% Physical Exam CONSTITUTIONAL: Well developed/well nourished HEAD: Normocephalic/atraumatic EYES: EOMI/PERRL ENMT: Mucous membranes moist; no oral trauma noted NECK: supple no meningeal signs SPINE/BACK:entire spine nontender CV: S1/S2 noted, no murmurs/rubs/gallops noted LUNGS: Lungs are clear to auscultation bilaterally, no  apparent distress ABDOMEN: soft, nontender, no rebound or guarding, bowel sounds noted throughout abdomen GU:no cva tenderness NEURO: Pt is awake follows some commands;  Pt speaking incoherently; moves all extremities x 4 EXTREMITIES: pulses normal/equal, full ROM SKIN: warm, color normal PSYCH: unable to assess  ED Course  Procedures  DIAGNOSTIC STUDIES: Oxygen Saturation is 100% on O2, normal by my interpretation.    COORDINATION OF CARE: 3:46 PM Discussed treatment plan with patient at beside, the patient agrees with the  plan and has no further questions at this time.  4:28 PM Spoke to family (sister and significant other) They witnessed event - reports she walked out of elevator and leaned against wall and slid down to floor then had 30 second SZ that terminated spontaneously.  Per family, she has had this before.  She has had workup previously in [facility] She had negative CT Head in PACS 2014 Pt resting comfortably at this time 5:38 PM Pt more awake/alert No distress Will ambulate and give PO fluids 7:22 PM Pt back to baseline She is taking PO She is ambulatory without difficulty She reports visiting her family here in hospital, she felt excited which precipitated this event She reports having these types of episodes before  She reports she has mild CP but reports similar to prior episodes like today She denies h/o CAD/PE I doubt ACS/PE/Dissection I feel she is appropriate for d/c home Given outpatient referral info for seizures    Labs Review Labs Reviewed  BASIC METABOLIC PANEL - Abnormal; Notable for the following:    Potassium 3.4 (*)    Glucose, Bld 136 (*)    GFR calc non Af Amer 58 (*)    GFR calc Af Amer 67 (*)    All other components within normal limits  CBC WITH DIFFERENTIAL/PLATELET - Abnormal; Notable for the following:    RBC 3.77 (*)    Hemoglobin 11.8 (*)    HCT 35.5 (*)    All other components within normal limits  CBG MONITORING, ED - Abnormal; Notable for the following:    Glucose-Capillary 124 (*)    All other components within normal limits  I-STAT TROPOININ, ED    Imaging Review Dg Chest Portable 1 View  01/04/2015   CLINICAL DATA:  Chest pain.  Seizure.  Hypertension.  EXAM: PORTABLE CHEST - 1 VIEW  COMPARISON:  03/16/2013  FINDINGS: Borderline cardiomegaly. The lungs appear clear. No pleural effusion. Mild thoracic spondylosis.  IMPRESSION: 1. Borderline cardiomegaly. Mild thoracic spondylosis. Otherwise, no significant abnormalities are observed.    Electronically Signed   By: Gaylyn Rong M.D.   On: 01/04/2015 16:07     EKG Interpretation   Date/Time:  Friday January 04 2015 15:36:46 EST Ventricular Rate:  90 PR Interval:  200 QRS Duration: 100 QT Interval:  363 QTC Calculation: 444 R Axis:   35 Text Interpretation:  Sinus rhythm Abnormal inferior Q waves Baseline  wander in lead(s) II III aVF artifact noted artifact limits evaluation  Confirmed by Bebe Shaggy  MD, Dorinda Hill ([id number]) on 01/04/2015 3:39:50 PM      MDM   Final diagnoses:  Seizure-like activity  Chest pain, unspecified chest pain type  Anemia, unspecified anemia type    Nursing notes including past medical history and social history reviewed and considered in documentation xrays/imaging reviewed by myself and considered during evaluation Labs/vital reviewed myself and considered during evaluation   I personally performed the services described in this documentation, which was scribed in my presence. The  recorded information has been reviewed and is accurate.      Zadie Rhineonald Renji Berwick, MD 01/04/15 [phone]

## 2015-01-04 NOTE — ED Notes (Signed)
Patient ambulated in hall to restroom with no difficulty. Patient drinking fluids and tolerating them at this time. Patient states is ready to go home.

## 2015-01-04 NOTE — ED Notes (Signed)
Pt here visiting her grandson per friend. States pt had a seizure. Pt ws found lying on the floor in front of elevator by other staff. Pt placed on stretcher and brought to the ED for evaluation

## 2015-06-24 ENCOUNTER — Other Ambulatory Visit: Payer: Self-pay | Admitting: Orthopaedic Surgery

## 2015-07-11 ENCOUNTER — Encounter (HOSPITAL_COMMUNITY): Payer: Self-pay

## 2015-07-11 ENCOUNTER — Encounter (HOSPITAL_COMMUNITY)
Admission: RE | Admit: 2015-07-11 | Discharge: 2015-07-11 | Disposition: A | Discharge status: Home / Self Care | Payer: BLUE CROSS/BLUE SHIELD | Source: Ambulatory Visit | Attending: Orthopaedic Surgery | Admitting: Orthopaedic Surgery

## 2015-07-11 DIAGNOSIS — Z0183 Encounter for blood typing: Secondary | ICD-10-CM | POA: Diagnosis not present

## 2015-07-11 DIAGNOSIS — Z01818 Encounter for other preprocedural examination: Secondary | ICD-10-CM | POA: Insufficient documentation

## 2015-07-11 DIAGNOSIS — I10 Essential (primary) hypertension: Secondary | ICD-10-CM | POA: Diagnosis not present

## 2015-07-11 DIAGNOSIS — Z01812 Encounter for preprocedural laboratory examination: Secondary | ICD-10-CM | POA: Diagnosis not present

## 2015-07-11 DIAGNOSIS — M1612 Unilateral primary osteoarthritis, left hip: Secondary | ICD-10-CM | POA: Diagnosis not present

## 2015-07-11 HISTORY — DX: Pain in unspecified joint: M25.50

## 2015-07-11 HISTORY — DX: Edema, unspecified: R60.9

## 2015-07-11 HISTORY — DX: Personal history of other medical treatment: Z92.89

## 2015-07-11 HISTORY — DX: Personal history of colonic polyps: Z86.010

## 2015-07-11 HISTORY — DX: Personal history of colon polyps, unspecified: Z86.0100

## 2015-07-11 HISTORY — DX: Headache, unspecified: R51.9

## 2015-07-11 HISTORY — DX: Unspecified convulsions: R56.9

## 2015-07-11 HISTORY — DX: Localized edema: R60.0

## 2015-07-11 HISTORY — DX: Unspecified osteoarthritis, unspecified site: M19.90

## 2015-07-11 HISTORY — DX: Dorsalgia, unspecified: M54.9

## 2015-07-11 HISTORY — DX: Family history of other specified conditions: Z84.89

## 2015-07-11 HISTORY — DX: Headache: R51

## 2015-07-11 HISTORY — DX: Pneumonia, unspecified organism: J18.9

## 2015-07-11 HISTORY — DX: Insomnia, unspecified: G47.00

## 2015-07-11 LAB — CBC WITH DIFFERENTIAL/PLATELET
BASOS ABS: 0 10*3/uL (ref 0.0–0.1)
Basophils Relative: 0 %
Eosinophils Absolute: 0.1 10*3/uL (ref 0.0–0.7)
Eosinophils Relative: 1 %
HEMATOCRIT: 35.4 % — AB (ref 36.0–46.0)
HEMOGLOBIN: 11.6 g/dL — AB (ref 12.0–15.0)
LYMPHS ABS: 2.1 10*3/uL (ref 0.7–4.0)
LYMPHS PCT: 37 %
MCH: 31.2 pg (ref 26.0–34.0)
MCHC: 32.8 g/dL (ref 30.0–36.0)
MCV: 95.2 fL (ref 78.0–100.0)
Monocytes Absolute: 0.4 10*3/uL (ref 0.1–1.0)
Monocytes Relative: 7 %
NEUTROS ABS: 3.2 10*3/uL (ref 1.7–7.7)
NEUTROS PCT: 56 %
Platelets: 340 10*3/uL (ref 150–400)
RBC: 3.72 MIL/uL — AB (ref 3.87–5.11)
RDW: 13.1 % (ref 11.5–15.5)
WBC: 5.8 10*3/uL (ref 4.0–10.5)

## 2015-07-11 LAB — URINE MICROSCOPIC-ADD ON

## 2015-07-11 LAB — URINALYSIS, ROUTINE W REFLEX MICROSCOPIC
GLUCOSE, UA: NEGATIVE mg/dL
HGB URINE DIPSTICK: NEGATIVE
Ketones, ur: NEGATIVE mg/dL
Nitrite: NEGATIVE
PROTEIN: NEGATIVE mg/dL
SPECIFIC GRAVITY, URINE: 1.019 (ref 1.005–1.030)
UROBILINOGEN UA: 0.2 mg/dL (ref 0.0–1.0)
pH: 5.5 (ref 5.0–8.0)

## 2015-07-11 LAB — BASIC METABOLIC PANEL
ANION GAP: 8 (ref 5–15)
BUN: 19 mg/dL (ref 6–20)
CHLORIDE: 105 mmol/L (ref 101–111)
CO2: 25 mmol/L (ref 22–32)
Calcium: 9.7 mg/dL (ref 8.9–10.3)
Creatinine, Ser: 1.11 mg/dL — ABNORMAL HIGH (ref 0.44–1.00)
GFR, EST AFRICAN AMERICAN: 60 mL/min — AB (ref >60)
GFR, EST NON AFRICAN AMERICAN: 52 mL/min — AB (ref >60)
Glucose, Bld: 100 mg/dL — ABNORMAL HIGH (ref 65–99)
POTASSIUM: 3.6 mmol/L (ref 3.5–5.1)
SODIUM: 138 mmol/L (ref 135–145)

## 2015-07-11 LAB — APTT: aPTT: 32 seconds (ref 24–37)

## 2015-07-11 LAB — SURGICAL PCR SCREEN
MRSA, PCR: NEGATIVE
STAPHYLOCOCCUS AUREUS: NEGATIVE

## 2015-07-11 LAB — PROTIME-INR
INR: 1.06 (ref 0.00–1.49)
Prothrombin Time: 14 seconds (ref 11.6–15.2)

## 2015-07-11 NOTE — Progress Notes (Signed)
Denies having a cardiologist  MEdical Md is Dr.Katherine Manson Passey in [place]  EKG in epic from 01-04-15  Denies CXR in past yr  Echo/Stress test done > 10yrs ago  Denies ever having a heart cath

## 2015-07-11 NOTE — Pre-Procedure Instructions (Signed)
Angel Wolf  07/11/2015      [id number] - [place], [place] - [address] [place] [address] Phone: [phone] Fax: [phone]    Your procedure is scheduled on Tues, Sept 27 @ 10:10 AM  Report to [facility] Admitting at 8:10 AM  Call this number if you have problems the morning of surgery:  [phone]   Remember:  Do not eat food or drink liquids after midnight.  Take these medicines the morning of surgery with A SIP OF WATER Pain Pill(if needed) and Ditropan(Oxybutynin)              Stop taking your Aspirin,Vitamins,and any Herbal Medications a week before surgery. No Goody's,BC's,Aleve,Ibuprofen,or Fish Oil.   Do not wear jewelry, make-up or nail polish.  Do not wear lotions, powders, or perfumes.  You may wear deodorant.  Do not shave 48 hours prior to surgery.   Do not bring valuables to the hospital.  [facility] is not responsible for any belongings or valuables.  Contacts, dentures or bridgework may not be worn into surgery.  Leave your suitcase in the car.  After surgery it may be brought to your room.  For patients admitted to the hospital, discharge time will be determined by your treatment team.  Patients discharged the day of surgery will not be allowed to drive home.    Special instructions:  [facility] - Preparing for Surgery  Before surgery, you can play an important role.  Because skin is not sterile, your skin needs to be as free of germs as possible.  You can reduce the number of germs on you skin by washing with CHG (chlorahexidine gluconate) soap before surgery.  CHG is an antiseptic cleaner which kills germs and bonds with the skin to continue killing germs even after washing.  Please DO NOT use if you have an allergy to CHG or antibacterial soaps.  If your skin becomes reddened/irritated stop using the CHG and inform your nurse when you arrive at Short Stay.  Do  not shave (including legs and underarms) for at least 48 hours prior to the first CHG shower.  You may shave your face.  Please follow these instructions carefully:   1.  Shower with CHG Soap the night before surgery and the                                morning of Surgery.  2.  If you choose to wash your hair, wash your hair first as usual with your       normal shampoo.  3.  After you shampoo, rinse your hair and body thoroughly to remove the                      Shampoo.  4.  Use CHG as you would any other liquid soap.  You can apply chg directly       to the skin and wash gently with scrungie or a clean washcloth.  5.  Apply the CHG Soap to your body ONLY FROM THE NECK DOWN.        Do not use on open wounds or open sores.  Avoid contact with your eyes,       ears, mouth and genitals (private parts).  Wash genitals (private parts)  with your normal soap.  6.  Wash thoroughly, paying special attention to the area where your surgery        will be performed.  7.  Thoroughly rinse your body with warm water from the neck down.  8.  DO NOT shower/wash with your normal soap after using and rinsing off       the CHG Soap.  9.  Pat yourself dry with a clean towel.            10.  Wear clean pajamas.            11.  Place clean sheets on your bed the night of your first shower and do not        sleep with pets.  Day of Surgery  Do not apply any lotions/deoderants the morning of surgery.  Please wear clean clothes to the hospital/surgery center.    Please read over the following fact sheets that you were given. Pain Booklet, Coughing and Deep Breathing, Blood Transfusion Information, MRSA Information and Surgical Site Infection Prevention

## 2015-07-22 MED ORDER — DEXTROSE IN LACTATED RINGERS 5 % IV SOLN: INTRAVENOUS | Status: DC

## 2015-07-22 MED ORDER — CEFAZOLIN SODIUM-DEXTROSE 2-3 GM-% IV SOLR
2.0000 g | INTRAVENOUS | Status: AC
  Administered 2015-07-23: 2 g via INTRAVENOUS
  Filled 2015-07-22: qty 50

## 2015-07-22 MED ORDER — CHLORHEXIDINE GLUCONATE 4 % EX LIQD: 60.0000 mL | Freq: Once | CUTANEOUS | Status: DC

## 2015-07-22 NOTE — H&P (Signed)
TOTAL HIP ADMISSION H&P  Patient is admitted for left total hip arthroplasty.  Subjective:  Chief Complaint: left hip pain  HPI: Angel Wolf, 64 y.o. female, has a history of pain and functional disability in the left hip(s) due to arthritis and patient has failed non-surgical conservative treatments for greater than 12 weeks to include NSAID's and/or analgesics, corticosteriod injections, flexibility and strengthening excercises, supervised PT with diminished ADL's post treatment, use of assistive devices, weight reduction as appropriate and activity modification.  Onset of symptoms was gradual starting 5 years ago with gradually worsening course since that time.The patient noted no past surgery on the left hip(s).  Patient currently rates pain in the left hip at 10 out of 10 with activity. Patient has night pain, worsening of pain with activity and weight bearing, trendelenberg gait, pain that interfers with activities of daily living and crepitus. Patient has evidence of subchondral cysts, subchondral sclerosis, periarticular osteophytes and joint space narrowing by imaging studies. This condition presents safety issues increasing the risk of falls.  There is no current active infection.  Patient Active Problem List   Diagnosis Date Noted  . Postoperative anemia due to acute blood loss 03/26/2013  . DJD (degenerative joint disease) of hip 03/23/2013    Class: Chronic   Past Medical History  Diagnosis Date  . Frequent urination     takes Ditropan daily  . Hyperlipemia     takes Atorvastatin daily  . Hypertension     takes Hyzaar daily  . Insomnia     takes Trazodone nightly  . Family history of adverse reaction to anesthesia     daughter fights as waking up from anesthesia  . Pneumonia as a child  . Headache     occasionally  . Seizures as a child  . Arthritis   . Joint pain   . Peripheral edema     takes Lasix daily  . Back pain     compensating from hips  . History  of colon polyps     benign  . History of blood transfusion     Past Surgical History  Procedure Laterality Date  . Abdominal hysterectomy    . Total hip arthroplasty Right 03/23/2013    Procedure: RIGHT TOTAL HIP ARTHROPLASTY ANTERIOR APPROACH;  Surgeon: Velna Ochs, MD;  Location: [facility] OR;  Service: Orthopedics;  Laterality: Right;  . Colonoscopy    . Esophagogastroduodenoscopy      No prescriptions prior to admission   No Known Allergies  Social History  Substance Use Topics  . Smoking status: Never Smoker   . Smokeless tobacco: Not on file  . Alcohol Use: Yes     Comment: Rare on special occasions    No family history on file.   Review of Systems  Musculoskeletal: Positive for joint pain.       Left hip  All other systems reviewed and are negative.   Objective:  Physical Exam  Constitutional: She is oriented to person, place, and time. She appears well-developed and well-nourished.  HENT:  Head: Normocephalic and atraumatic.  Eyes: Pupils are equal, round, and reactive to light.  Neck: Normal range of motion.  Cardiovascular: Normal rate and regular rhythm.   Respiratory: Effort normal.  Musculoskeletal:  Her left hip motion is very painful especially in internal rotation.  Straight leg raise is negative.  Her leg lengths appear equal.  Sensation and motor function are intact distally with palpable pulses on both sides.  There is no  palpable lymphadenopathy about her groin.   Neurological: She is alert and oriented to person, place, and time.  Skin: Skin is warm and dry.  Psychiatric: She has a normal mood and affect. Her behavior is normal. Judgment and thought content normal.    Vital signs in last 24 hours:    Labs:   Estimated body mass index is 37.14 kg/(m^2) as calculated from the following:   Height as of 03/16/13:  (1.676 m).   Weight as of 03/16/13: 104.327 kg (230 lb).   Imaging Review Plain radiographs demonstrate severe degenerative joint  disease of the left hip(s). The bone quality appears to be good for age and reported activity level.  Assessment/Plan:  End stage primary arthritis, left hip(s)  The patient history, physical examination, clinical judgement of the provider and imaging studies are consistent with end stage degenerative joint disease of the left hip(s) and total hip arthroplasty is deemed medically necessary. The treatment options including medical management, injection therapy, arthroscopy and arthroplasty were discussed at length. The risks and benefits of total hip arthroplasty were presented and reviewed. The risks due to aseptic loosening, infection, stiffness, dislocation/subluxation,  thromboembolic complications and other imponderables were discussed.  The patient acknowledged the explanation, agreed to proceed with the plan and consent was signed. Patient is being admitted for inpatient treatment for surgery, pain control, PT, OT, prophylactic antibiotics, VTE prophylaxis, progressive ambulation and ADL's and discharge planning.The patient is planning to be discharged home with home health services

## 2015-07-23 ENCOUNTER — Inpatient Hospital Stay (HOSPITAL_COMMUNITY): Payer: BLUE CROSS/BLUE SHIELD | Admitting: Anesthesiology

## 2015-07-23 ENCOUNTER — Inpatient Hospital Stay (HOSPITAL_COMMUNITY): Payer: BLUE CROSS/BLUE SHIELD | Admitting: Certified Registered"

## 2015-07-23 ENCOUNTER — Encounter (HOSPITAL_COMMUNITY)
Admission: RE | Disposition: A | Discharge status: Home Health | Payer: Self-pay | Source: Ambulatory Visit | Attending: Orthopaedic Surgery

## 2015-07-23 ENCOUNTER — Inpatient Hospital Stay (HOSPITAL_COMMUNITY): Payer: BLUE CROSS/BLUE SHIELD

## 2015-07-23 ENCOUNTER — Inpatient Hospital Stay (HOSPITAL_COMMUNITY)
Admission: RE | Admit: 2015-07-23 | Discharge: 2015-07-26 | DRG: 470 | Disposition: A | Discharge status: Home Health | Payer: BLUE CROSS/BLUE SHIELD | Source: Ambulatory Visit | Attending: Orthopaedic Surgery | Admitting: Orthopaedic Surgery

## 2015-07-23 DIAGNOSIS — I10 Essential (primary) hypertension: Secondary | ICD-10-CM | POA: Diagnosis present

## 2015-07-23 DIAGNOSIS — R350 Frequency of micturition: Secondary | ICD-10-CM | POA: Diagnosis present

## 2015-07-23 DIAGNOSIS — G4700 Insomnia, unspecified: Secondary | ICD-10-CM | POA: Diagnosis present

## 2015-07-23 DIAGNOSIS — M25552 Pain in left hip: Secondary | ICD-10-CM | POA: Diagnosis present

## 2015-07-23 DIAGNOSIS — Z96641 Presence of right artificial hip joint: Secondary | ICD-10-CM | POA: Diagnosis not present

## 2015-07-23 DIAGNOSIS — Z419 Encounter for procedure for purposes other than remedying health state, unspecified: Secondary | ICD-10-CM

## 2015-07-23 DIAGNOSIS — R600 Localized edema: Secondary | ICD-10-CM | POA: Diagnosis present

## 2015-07-23 DIAGNOSIS — E785 Hyperlipidemia, unspecified: Secondary | ICD-10-CM | POA: Diagnosis present

## 2015-07-23 DIAGNOSIS — M1612 Unilateral primary osteoarthritis, left hip: Secondary | ICD-10-CM | POA: Diagnosis present

## 2015-07-23 HISTORY — PX: TOTAL HIP ARTHROPLASTY: SHX124

## 2015-07-23 LAB — TYPE AND SCREEN
ABO/RH(D): A POS
ANTIBODY SCREEN: POSITIVE
DAT, IgG: NEGATIVE
DONOR AG TYPE: NEGATIVE
DONOR AG TYPE: NEGATIVE
Unit division: 0
Unit division: 0

## 2015-07-23 SURGERY — ARTHROPLASTY, HIP, TOTAL, ANTERIOR APPROACH
Anesthesia: General | Site: Hip | Laterality: Left

## 2015-07-23 MED ORDER — PHENYLEPHRINE HCL 10 MG/ML IJ SOLN
INTRAMUSCULAR | Status: DC | PRN
  Administered 2015-07-23: 80 ug via INTRAVENOUS
  Administered 2015-07-23: 40 ug via INTRAVENOUS

## 2015-07-23 MED ORDER — OXYCODONE HCL 5 MG/5ML PO SOLN: 5.0000 mg | Freq: Once | ORAL | Status: AC | PRN

## 2015-07-23 MED ORDER — METOCLOPRAMIDE HCL 5 MG/ML IJ SOLN: 5.0000 mg | Freq: Three times a day (TID) | INTRAMUSCULAR | Status: DC | PRN

## 2015-07-23 MED ORDER — FENTANYL CITRATE (PF) 250 MCG/5ML IJ SOLN
INTRAMUSCULAR | Status: AC
  Filled 2015-07-23: qty 5

## 2015-07-23 MED ORDER — METHOCARBAMOL 1000 MG/10ML IJ SOLN
500.0000 mg | Freq: Four times a day (QID) | INTRAVENOUS | Status: DC | PRN
  Filled 2015-07-23: qty 5

## 2015-07-23 MED ORDER — CEFAZOLIN SODIUM-DEXTROSE 2-3 GM-% IV SOLR
2.0000 g | Freq: Four times a day (QID) | INTRAVENOUS | Status: AC
  Administered 2015-07-23 (×2): 2 g via INTRAVENOUS
  Filled 2015-07-23 (×2): qty 50

## 2015-07-23 MED ORDER — ROCURONIUM BROMIDE 50 MG/5ML IV SOLN
INTRAVENOUS | Status: AC
  Filled 2015-07-23: qty 1

## 2015-07-23 MED ORDER — DEXAMETHASONE SODIUM PHOSPHATE 4 MG/ML IJ SOLN
INTRAMUSCULAR | Status: AC
  Filled 2015-07-23: qty 1

## 2015-07-23 MED ORDER — OXYCODONE HCL 5 MG PO TABS
ORAL_TABLET | ORAL | Status: AC
  Filled 2015-07-23: qty 1

## 2015-07-23 MED ORDER — GLYCOPYRROLATE 0.2 MG/ML IJ SOLN
INTRAMUSCULAR | Status: AC
  Filled 2015-07-23: qty 2

## 2015-07-23 MED ORDER — ACETAMINOPHEN 650 MG RE SUPP
650.0000 mg | Freq: Four times a day (QID) | RECTAL | Status: DC | PRN
Start: 2015-07-23 — End: 2015-07-26

## 2015-07-23 MED ORDER — MENTHOL 3 MG MT LOZG: 1.0000 | LOZENGE | OROMUCOSAL | Status: DC | PRN

## 2015-07-23 MED ORDER — ROCURONIUM BROMIDE 100 MG/10ML IV SOLN
INTRAVENOUS | Status: DC | PRN
  Administered 2015-07-23: 50 mg via INTRAVENOUS

## 2015-07-23 MED ORDER — LIDOCAINE HCL (CARDIAC) 20 MG/ML IV SOLN
INTRAVENOUS | Status: AC
  Filled 2015-07-23: qty 5

## 2015-07-23 MED ORDER — DIPHENHYDRAMINE HCL 12.5 MG/5ML PO ELIX: 12.5000 mg | ORAL_SOLUTION | ORAL | Status: DC | PRN

## 2015-07-23 MED ORDER — NEOSTIGMINE METHYLSULFATE 10 MG/10ML IV SOLN
INTRAVENOUS | Status: DC | PRN
  Administered 2015-07-23: 3 mg via INTRAVENOUS

## 2015-07-23 MED ORDER — PROPOFOL 10 MG/ML IV BOLUS
INTRAVENOUS | Status: AC
  Filled 2015-07-23: qty 20

## 2015-07-23 MED ORDER — HYDROMORPHONE HCL 1 MG/ML IJ SOLN
0.5000 mg | INTRAMUSCULAR | Status: DC | PRN
  Administered 2015-07-24: 1 mg via INTRAVENOUS
  Filled 2015-07-23: qty 1

## 2015-07-23 MED ORDER — PHENOL 1.4 % MT LIQD: 1.0000 | OROMUCOSAL | Status: DC | PRN

## 2015-07-23 MED ORDER — MIDAZOLAM HCL 2 MG/2ML IJ SOLN
INTRAMUSCULAR | Status: AC
  Filled 2015-07-23: qty 4

## 2015-07-23 MED ORDER — LOSARTAN POTASSIUM 50 MG PO TABS
100.0000 mg | ORAL_TABLET | Freq: Every day | ORAL | Status: DC
  Administered 2015-07-24 – 2015-07-26 (×3): 100 mg via ORAL
  Filled 2015-07-23 (×3): qty 2

## 2015-07-23 MED ORDER — ONDANSETRON HCL 4 MG/2ML IJ SOLN
INTRAMUSCULAR | Status: AC
  Filled 2015-07-23: qty 2

## 2015-07-23 MED ORDER — HYDROCODONE-ACETAMINOPHEN 7.5-325 MG PO TABS
1.0000 | ORAL_TABLET | ORAL | Status: DC | PRN
  Administered 2015-07-23 – 2015-07-24 (×3): 2 via ORAL
  Administered 2015-07-25: 1 via ORAL
  Administered 2015-07-25 – 2015-07-26 (×3): 2 via ORAL
  Filled 2015-07-23 (×6): qty 2
  Filled 2015-07-23: qty 1
  Filled 2015-07-23 (×2): qty 2

## 2015-07-23 MED ORDER — NEOSTIGMINE METHYLSULFATE 10 MG/10ML IV SOLN
INTRAVENOUS | Status: AC
  Filled 2015-07-23: qty 1

## 2015-07-23 MED ORDER — ATORVASTATIN CALCIUM 10 MG PO TABS
10.0000 mg | ORAL_TABLET | Freq: Every day | ORAL | Status: DC
  Administered 2015-07-23 – 2015-07-25 (×3): 10 mg via ORAL
  Filled 2015-07-23 (×3): qty 1

## 2015-07-23 MED ORDER — LACTATED RINGERS IV SOLN
INTRAVENOUS | Status: DC
  Administered 2015-07-23: via INTRAVENOUS

## 2015-07-23 MED ORDER — MIDAZOLAM HCL 5 MG/5ML IJ SOLN
INTRAMUSCULAR | Status: DC | PRN
  Administered 2015-07-23: 2 mg via INTRAVENOUS

## 2015-07-23 MED ORDER — GLYCOPYRROLATE 0.2 MG/ML IJ SOLN
INTRAMUSCULAR | Status: DC | PRN
  Administered 2015-07-23: 0.4 mg via INTRAVENOUS

## 2015-07-23 MED ORDER — LOSARTAN POTASSIUM-HCTZ 100-12.5 MG PO TABS: 1.0000 | ORAL_TABLET | Freq: Every day | ORAL | Status: DC

## 2015-07-23 MED ORDER — METHOCARBAMOL 1000 MG/10ML IJ SOLN
500.0000 mg | INTRAMUSCULAR | Status: AC
  Administered 2015-07-23: 500 mg via INTRAVENOUS
  Filled 2015-07-23: qty 5

## 2015-07-23 MED ORDER — METOCLOPRAMIDE HCL 5 MG PO TABS: 5.0000 mg | ORAL_TABLET | Freq: Three times a day (TID) | ORAL | Status: DC | PRN

## 2015-07-23 MED ORDER — DOCUSATE SODIUM 100 MG PO CAPS
100.0000 mg | ORAL_CAPSULE | Freq: Two times a day (BID) | ORAL | Status: DC
  Administered 2015-07-23 – 2015-07-26 (×6): 100 mg via ORAL
  Filled 2015-07-23 (×6): qty 1

## 2015-07-23 MED ORDER — BISACODYL 5 MG PO TBEC: 5.0000 mg | DELAYED_RELEASE_TABLET | Freq: Every day | ORAL | Status: DC | PRN

## 2015-07-23 MED ORDER — FENTANYL CITRATE (PF) 100 MCG/2ML IJ SOLN
INTRAMUSCULAR | Status: DC | PRN
  Administered 2015-07-23 (×2): 50 ug via INTRAVENOUS
  Administered 2015-07-23: 150 ug via INTRAVENOUS

## 2015-07-23 MED ORDER — ONDANSETRON HCL 4 MG/2ML IJ SOLN
INTRAMUSCULAR | Status: DC | PRN
  Administered 2015-07-23: 4 mg via INTRAVENOUS

## 2015-07-23 MED ORDER — LIDOCAINE HCL (CARDIAC) 20 MG/ML IV SOLN
INTRAVENOUS | Status: DC | PRN
  Administered 2015-07-23: 100 mg via INTRAVENOUS

## 2015-07-23 MED ORDER — OXYBUTYNIN CHLORIDE 5 MG PO TABS
5.0000 mg | ORAL_TABLET | Freq: Every day | ORAL | Status: DC
  Administered 2015-07-24 – 2015-07-26 (×3): 5 mg via ORAL
  Filled 2015-07-23 (×4): qty 1

## 2015-07-23 MED ORDER — ONDANSETRON HCL 4 MG/2ML IJ SOLN
4.0000 mg | Freq: Four times a day (QID) | INTRAMUSCULAR | Status: DC | PRN
  Administered 2015-07-24: 4 mg via INTRAVENOUS
  Filled 2015-07-23: qty 2

## 2015-07-23 MED ORDER — PROPOFOL 10 MG/ML IV BOLUS
INTRAVENOUS | Status: DC | PRN
  Administered 2015-07-23: 160 mg via INTRAVENOUS

## 2015-07-23 MED ORDER — ONDANSETRON HCL 4 MG/2ML IJ SOLN: 4.0000 mg | Freq: Four times a day (QID) | INTRAMUSCULAR | Status: DC | PRN

## 2015-07-23 MED ORDER — ASPIRIN EC 325 MG PO TBEC
325.0000 mg | DELAYED_RELEASE_TABLET | Freq: Two times a day (BID) | ORAL | Status: DC
  Administered 2015-07-24 – 2015-07-26 (×5): 325 mg via ORAL
  Filled 2015-07-23 (×5): qty 1

## 2015-07-23 MED ORDER — HYDROMORPHONE HCL 1 MG/ML IJ SOLN
INTRAMUSCULAR | Status: AC
  Filled 2015-07-23: qty 2

## 2015-07-23 MED ORDER — LACTATED RINGERS IV SOLN
INTRAVENOUS | Status: DC
  Administered 2015-07-23 (×2): via INTRAVENOUS

## 2015-07-23 MED ORDER — ACETAMINOPHEN 325 MG PO TABS: 650.0000 mg | ORAL_TABLET | Freq: Four times a day (QID) | ORAL | Status: DC | PRN

## 2015-07-23 MED ORDER — TRANEXAMIC ACID 1000 MG/10ML IV SOLN
1000.0000 mg | INTRAVENOUS | Status: AC
  Administered 2015-07-23: 1000 mg via INTRAVENOUS
  Filled 2015-07-23: qty 10

## 2015-07-23 MED ORDER — TRAZODONE HCL 50 MG PO TABS
50.0000 mg | ORAL_TABLET | Freq: Every day | ORAL | Status: DC
  Administered 2015-07-23 – 2015-07-25 (×3): 50 mg via ORAL
  Filled 2015-07-23 (×3): qty 1

## 2015-07-23 MED ORDER — FUROSEMIDE 20 MG PO TABS
20.0000 mg | ORAL_TABLET | Freq: Two times a day (BID) | ORAL | Status: DC
  Administered 2015-07-23 – 2015-07-26 (×6): 20 mg via ORAL
  Filled 2015-07-23 (×6): qty 1

## 2015-07-23 MED ORDER — PHENYLEPHRINE 40 MCG/ML (10ML) SYRINGE FOR IV PUSH (FOR BLOOD PRESSURE SUPPORT)
PREFILLED_SYRINGE | INTRAVENOUS | Status: AC
  Filled 2015-07-23: qty 10

## 2015-07-23 MED ORDER — ONDANSETRON HCL 4 MG PO TABS: 4.0000 mg | ORAL_TABLET | Freq: Four times a day (QID) | ORAL | Status: DC | PRN

## 2015-07-23 MED ORDER — METHOCARBAMOL 500 MG PO TABS
500.0000 mg | ORAL_TABLET | Freq: Four times a day (QID) | ORAL | Status: DC | PRN
Start: 2015-07-23 — End: 2015-07-26
  Administered 2015-07-23 – 2015-07-26 (×4): 500 mg via ORAL
  Filled 2015-07-23 (×5): qty 1

## 2015-07-23 MED ORDER — HYDROMORPHONE HCL 1 MG/ML IJ SOLN
0.2500 mg | INTRAMUSCULAR | Status: DC | PRN
  Administered 2015-07-23 (×4): 0.5 mg via INTRAVENOUS

## 2015-07-23 MED ORDER — OXYCODONE HCL 5 MG PO TABS
5.0000 mg | ORAL_TABLET | Freq: Once | ORAL | Status: AC | PRN
  Administered 2015-07-23: 5 mg via ORAL

## 2015-07-23 MED ORDER — HYDROCHLOROTHIAZIDE 12.5 MG PO CAPS
12.5000 mg | ORAL_CAPSULE | Freq: Every day | ORAL | Status: DC
  Administered 2015-07-24 – 2015-07-26 (×3): 12.5 mg via ORAL
  Filled 2015-07-23 (×3): qty 1

## 2015-07-23 MED ORDER — ALUM & MAG HYDROXIDE-SIMETH 200-200-20 MG/5ML PO SUSP: 30.0000 mL | ORAL | Status: DC | PRN

## 2015-07-23 MED ORDER — DEXAMETHASONE SODIUM PHOSPHATE 4 MG/ML IJ SOLN
INTRAMUSCULAR | Status: DC | PRN
  Administered 2015-07-23: 4 mg via INTRAVENOUS

## 2015-07-23 SURGICAL SUPPLY — 54 items
APL SKNCLS STERI-STRIP NONHPOA (GAUZE/BANDAGES/DRESSINGS) ×1
BENZOIN TINCTURE PRP APPL 2/3 (GAUZE/BANDAGES/DRESSINGS) ×2 IMPLANT
BLADE SAW SGTL 18X1.27X75 (BLADE) ×2 IMPLANT
BLADE SAW SGTL 18X1.27X75MM (BLADE) ×1
BLADE SURG ROTATE 9660 (MISCELLANEOUS) IMPLANT
CAPT HIP TOTAL 2 ×2 IMPLANT
CELLS DAT CNTRL 66122 CELL SVR (MISCELLANEOUS) ×1 IMPLANT
CLOSURE STERI-STRIP 1/2X4 (GAUZE/BANDAGES/DRESSINGS) ×1
CLSR STERI-STRIP ANTIMIC 1/2X4 (GAUZE/BANDAGES/DRESSINGS) ×1 IMPLANT
COVER PERINEAL POST (MISCELLANEOUS) ×3 IMPLANT
COVER SURGICAL LIGHT HANDLE (MISCELLANEOUS) ×3 IMPLANT
DRAPE C-ARM 42X72 X-RAY (DRAPES) ×3 IMPLANT
DRAPE IMP U-DRAPE 54X76 (DRAPES) ×3 IMPLANT
DRAPE STERI IOBAN 125X83 (DRAPES) ×3 IMPLANT
DRAPE U-SHAPE 47X51 STRL (DRAPES) ×9 IMPLANT
DRSG AQUACEL AG ADV 3.5X10 (GAUZE/BANDAGES/DRESSINGS) ×3 IMPLANT
DURAPREP 26ML APPLICATOR (WOUND CARE) ×3 IMPLANT
ELECT BLADE 4.0 EZ CLEAN MEGAD (MISCELLANEOUS)
ELECT CAUTERY BLADE 6.4 (BLADE) ×3 IMPLANT
ELECT REM PT RETURN 9FT ADLT (ELECTROSURGICAL) ×3
ELECTRODE BLDE 4.0 EZ CLN MEGD (MISCELLANEOUS) IMPLANT
ELECTRODE REM PT RTRN 9FT ADLT (ELECTROSURGICAL) ×1 IMPLANT
FACESHIELD WRAPAROUND (MASK) ×9 IMPLANT
FACESHIELD WRAPAROUND OR TEAM (MASK) ×2 IMPLANT
GLOVE BIO SURGEON STRL SZ8 (GLOVE) ×15 IMPLANT
GLOVE BIOGEL PI IND STRL 8 (GLOVE) ×2 IMPLANT
GLOVE BIOGEL PI INDICATOR 8 (GLOVE) ×4
GOWN STRL REUS W/ TWL LRG LVL3 (GOWN DISPOSABLE) ×1 IMPLANT
GOWN STRL REUS W/ TWL XL LVL3 (GOWN DISPOSABLE) ×2 IMPLANT
GOWN STRL REUS W/TWL LRG LVL3 (GOWN DISPOSABLE) ×3
GOWN STRL REUS W/TWL XL LVL3 (GOWN DISPOSABLE) ×6
KIT BASIN OR (CUSTOM PROCEDURE TRAY) ×3 IMPLANT
KIT ROOM TURNOVER OR (KITS) ×3 IMPLANT
LINER BOOT UNIVERSAL DISP (MISCELLANEOUS) ×3 IMPLANT
MANIFOLD NEPTUNE II (INSTRUMENTS) ×3 IMPLANT
NS IRRIG 1000ML POUR BTL (IV SOLUTION) ×3 IMPLANT
PACK TOTAL JOINT (CUSTOM PROCEDURE TRAY) ×3 IMPLANT
PACK UNIVERSAL I (CUSTOM PROCEDURE TRAY) ×3 IMPLANT
PAD ARMBOARD 7.5X6 YLW CONV (MISCELLANEOUS) ×6 IMPLANT
RETRACTOR WND ALEXIS 18 MED (MISCELLANEOUS) ×1 IMPLANT
RTRCTR WOUND ALEXIS 18CM MED (MISCELLANEOUS) ×3
STAPLER VISISTAT 35W (STAPLE) ×3 IMPLANT
SUT ETHIBOND NAB CT1 #1 30IN (SUTURE) ×9 IMPLANT
SUT VIC AB 0 CT1 27 (SUTURE)
SUT VIC AB 0 CT1 27XBRD ANBCTR (SUTURE) IMPLANT
SUT VIC AB 1 CT1 27 (SUTURE) ×3
SUT VIC AB 1 CT1 27XBRD ANBCTR (SUTURE) ×1 IMPLANT
SUT VIC AB 2-0 CT1 27 (SUTURE) ×9
SUT VIC AB 2-0 CT1 TAPERPNT 27 (SUTURE) ×1 IMPLANT
SUT VLOC 180 0 24IN GS25 (SUTURE) ×3 IMPLANT
TOWEL OR 17X24 6PK STRL BLUE (TOWEL DISPOSABLE) ×3 IMPLANT
TOWEL OR 17X26 10 PK STRL BLUE (TOWEL DISPOSABLE) ×6 IMPLANT
TRAY FOLEY CATH 14FR (SET/KITS/TRAYS/PACK) IMPLANT
WATER STERILE IRR 1000ML POUR (IV SOLUTION) ×6 IMPLANT

## 2015-07-23 NOTE — Progress Notes (Signed)
Utilization review completed.  

## 2015-07-23 NOTE — Transfer of Care (Signed)
Immediate Anesthesia Transfer of Care Note  Patient: Angel Wolf  Procedure(s) Performed: Procedure(s): TOTAL HIP ARTHROPLASTY ANTERIOR APPROACH (Left)  Patient Location: PACU  Anesthesia Type:General  Level of Consciousness: awake, oriented and patient cooperative  Airway & Oxygen Therapy: Patient Spontanous Breathing and Patient connected to nasal cannula oxygen  Post-op Assessment: Report given to RN, Post -op Vital signs reviewed and stable and Patient moving all extremities  Post vital signs: Reviewed and stable  Last Vitals:  Filed Vitals:   07/23/15 0806  BP: 167/60  Pulse: 62  Temp: 36.8 C  Resp: 20    Complications: No apparent anesthesia complications

## 2015-07-23 NOTE — Anesthesia Postprocedure Evaluation (Signed)
Anesthesia Post Note  Patient: Angel Wolf  Procedure(s) Performed: Procedure(s) (LRB): TOTAL HIP ARTHROPLASTY ANTERIOR APPROACH (Left)  Anesthesia type: General  Patient location: PACU  Post pain: Pain level controlled and Adequate analgesia  Post assessment: Post-op Vital signs reviewed, Patient's Cardiovascular Status Stable, Respiratory Function Stable, Patent Airway and Pain level controlled  Last Vitals:  Filed Vitals:   07/23/15 1248  BP: 160/67  Pulse: 55  Temp:   Resp: 13    Post vital signs: Reviewed and stable  Level of consciousness: awake, alert  and oriented  Complications: No apparent anesthesia complications

## 2015-07-23 NOTE — Anesthesia Procedure Notes (Signed)
Procedure Name: Intubation Date/Time: 07/23/2015 10:26 AM Performed by: Charm Barges, DAVID R Pre-anesthesia Checklist: Patient identified, Emergency Drugs available, Suction available, Patient being monitored and Timeout performed Patient Re-evaluated:Patient Re-evaluated prior to inductionOxygen Delivery Method: Circle system utilized Preoxygenation: Pre-oxygenation with 100% oxygen Intubation Type: IV induction Ventilation: Mask ventilation without difficulty Laryngoscope Size: Mac and 3 Grade View: Grade I Tube type: Oral Tube size: 7.5 mm Number of attempts: 1 Airway Equipment and Method: Stylet Placement Confirmation: ETT inserted through vocal cords under direct vision,  positive ETCO2 and breath sounds checked- equal and bilateral Secured at: 21 cm Tube secured with: Tape Dental Injury: Teeth and Oropharynx as per pre-operative assessment

## 2015-07-23 NOTE — Interval H&P Note (Signed)
OK for surgery PD 

## 2015-07-23 NOTE — Op Note (Signed)
PRE-OP DIAGNOSIS:  LEFT HIP DEGENERATIVE JOINT DISEASE POST-OP DIAGNOSIS: same PROCEDURE:  LEFT TOTAL HIP ARTHROPLASTY ANTERIOR APPROACH ANESTHESIA:  General SURGEON:  Marcene Corning MD ASSISTANT:  Elodia Florence PA-C   INDICATIONS FOR PROCEDURE:  The patient is a 64 y.o. female with a long history of a painful hip.  This has persisted despite multiple conservative measures.  The patient has persisted with pain and dysfunction making rest and activity difficult.  A total hip replacement is offered as surgical treatment.  Informed operative consent was obtained after discussion of possible complications including reaction to anesthesia, infection, neurovascular injury, dislocation, DVT, PE, and death.  The importance of the postoperative rehab program to optimize result was stressed with the patient.  SUMMARY OF FINDINGS AND PROCEDURE:  Under general anesthesia through a anterior approach an the Hana table a left THR was performed.  The patient had severe degenerative change and good bone quality.  We used DePuy components to replace the hip and these were size KA 12 Corail femur capped with a +1 32mm ceramic hip ball.  On the acetabular side we used a size 50 Gription shell with a plus 4 neutral polyethylene liner.  We did use a hole eliminator.  Elodia Florence PA-C assisted throughout and was invaluable to the completion of the case in that he helped position and retract while I performed the procedure.  He also closed simultaneously to help minimize OR time.  I used fluoroscopy throughout the case to check position of components and leg lengths and read all these views myself.  DESCRIPTION OF PROCEDURE:  The patient was taken to the OR suite where general anesthetic was applied.  The patient was then positioned on the Hana table supine.  All bony prominences were appropriately padded.  Prep and drape was then performed in normal sterile fashion.  The patient was given kefzol preoperative antibiotic and an  appropriate time out was performed.  We then took an anterior approach to the left hip.  Dissection was taken through adipose to the tensor fascia lata fascia.  This structure was incised longitudinally and we dissected in the intermuscular interval just medial to this muscle.  Cobra retractors were placed superior and inferior to the femoral neck superficial to the capsule.  A capsular incision was then made and the retractors were placed along the femoral neck.  Xray was brought in to get a good level for the femoral neck cut which was made with an oscillating saw and osteotome.  The femoral head was removed with a corkscrew.  The acetabulum was exposed and some labral tissues were excised. Reaming was taken to the inside wall of the pelvis and sequentially up to 1 mm smaller than the actual component.  A trial of components was done and then the aforementioned acetabular shell was placed in appropriate tilt and anteversion confirmed by fluoroscopy. The liner was placed along with the hole eliminator and attention was turned to the femur.  The leg was brought down and over into adduction and the elevator bar was used to raise the femur up gently in the wound.  The piriformis was released with care taken to preserve the obturator internus attachment and all of the posterior capsule. The femur was reamed and then broached to the appropriate size.  A trial reduction was done and the aforementioned head and neck assembly gave [place] the best stability in extension with external rotation.  Leg lengths were felt to be about equal by fluoroscopic exam.  The  trial components were removed and the wound irrigated.  We then placed the femoral component in appropriate anteversion.  The head was applied to a dry stem neck and the hip again reduced.  It was again stable in the aforementioned position.  The would was irrigated again followed by re-approximation of anterior capsule with ethibond suture. Tensor fascia was repaired  with V-loc suture  followed by subcutaneous closure with #O and #2 undyed vicryl.  Skin was closed with subQ stitch and steristrips followed by a sterile dressing.  EBL and IOF can be obtained from anesthesia records.  DISPOSITION:  The patient was extubated in the OR and taken to PACU in stable condition to be admitted to the Orthopedic Surgery for appropriate post-op care to include perioperative antibiotics and DVT prophylaxis.

## 2015-07-23 NOTE — Anesthesia Preprocedure Evaluation (Addendum)
Anesthesia Evaluation  Patient identified by MRN, date of birth, ID band Patient awake    Reviewed: Allergy & Precautions, NPO status   Airway Mallampati: II  TM Distance: >3 FB Neck ROM: Full    Dental  (+) Teeth Intact   Pulmonary    breath sounds clear to auscultation       Cardiovascular hypertension,  Rhythm:Regular Rate:Normal     Neuro/Psych  Headaches, Seizures -,     GI/Hepatic negative GI ROS, Neg liver ROS,   Endo/Other  negative endocrine ROS  Renal/GU negative Renal ROS     Musculoskeletal  (+) Arthritis ,   Abdominal (+) + obese,   Peds  Hematology  (+) anemia ,   Anesthesia Other Findings   Reproductive/Obstetrics                            Anesthesia Physical Anesthesia Plan  ASA: II  Anesthesia Plan: General   Post-op Pain Management:    Induction: Intravenous  Airway Management Planned: Oral ETT  Additional Equipment:   Intra-op Plan:   Post-operative Plan: Extubation in OR  Informed Consent: I have reviewed the patients History and Physical, chart, labs and discussed the procedure including the risks, benefits and alternatives for the proposed anesthesia with the patient or authorized representative who has indicated his/her understanding and acceptance.     Plan Discussed with: CRNA and Surgeon  Anesthesia Plan Comments:        Anesthesia Quick Evaluation

## 2015-07-23 NOTE — Evaluation (Signed)
Physical Therapy Evaluation Patient Details Name: Angel Wolf MRN: [id number] DOB: 1951/08/22 Today's Date: 07/23/2015   History of Present Illness  LEFT TOTAL HIP ARTHROPLASTY ANTERIOR APPROACH  Clinical Impression  Pt is s/p direct anterior THA resulting in the deficits listed below (see PT Problem List). Pt will benefit from skilled PT to increase their independence and safety with mobility to allow discharge to home with family assistance. Patient seen for initial mobilization but limited by increasing nausea and dizziness while sitting at the edge of the bed. Will continue to progress mobility as tolerated.      Follow Up Recommendations Home health PT;Supervision for mobility/OOB    Equipment Recommendations  None recommended by PT (has rw at home)    Recommendations for Other Services       Precautions / Restrictions Precautions Precautions: Fall;Other (comment) (no hip precautions) Restrictions Weight Bearing Restrictions: Yes LLE Weight Bearing: Weight bearing as tolerated      Mobility  Bed Mobility Overal bed mobility: Needs Assistance Bed Mobility: Supine to Sit     Supine to sit: Mod assist     General bed mobility comments: able to sit at edge of bed, limited by increasing nausea and dizziness. Returned to supine  Transfers                    Ambulation/Gait                Stairs            Wheelchair Mobility    Modified Rankin (Stroke Patients Only)       Balance Overall balance assessment: Needs assistance Sitting-balance support: Single extremity supported Sitting balance-Leahy Scale: Poor                                       Pertinent Vitals/Pain Pain Assessment: 0-10 Pain Score: 8  Pain Location: Lt hip Pain Descriptors / Indicators: Aching;Sore Pain Intervention(s): Limited activity within patient's tolerance;Monitored during session    Home Living Family/patient expects to be  discharged to:: Private residence Living Arrangements: Spouse/significant other;Children Available Help at Discharge: Family Type of Home: House Home Access: Stairs to enter Entrance Stairs-Rails: Right Entrance Stairs-Number of Steps: 2   Home Equipment: Walker - 2 wheels      Prior Function Level of Independence: Independent               Hand Dominance        Extremity/Trunk Assessment               Lower Extremity Assessment: LLE deficits/detail   LLE Deficits / Details: moderate assist needed to move LLE in bed     Communication   Communication: No difficulties  Cognition Arousal/Alertness: Lethargic Behavior During Therapy: Flat affect Overall Cognitive Status: Within Functional Limits for tasks assessed                      General Comments      Exercises        Assessment/Plan    PT Assessment Patient needs continued PT services  PT Diagnosis Generalized weakness;Difficulty walking;Acute pain   PT Problem List Decreased strength;Decreased range of motion;Decreased activity tolerance;Decreased balance;Decreased mobility;Pain  PT Treatment Interventions DME instruction;Gait training;Stair training;Functional mobility training;Therapeutic activities;Therapeutic exercise;Balance training;Patient/family education   PT Goals (Current goals can be found in the Care Plan section) Acute Rehab  PT Goals Patient Stated Goal: go home from hospital PT Goal Formulation: With patient Time For Goal Achievement: 08/06/15 Potential to Achieve Goals: Good    Frequency 7X/week   Barriers to discharge        Co-evaluation               End of Session   Activity Tolerance: Other (comment) (nausea, dizziness) Patient left: in bed;with call bell/phone within reach;with family/visitor present Nurse Communication: Mobility status         Time: 4782-9562 PT Time Calculation (min) (ACUTE ONLY): 23 min   Charges:   PT  Evaluation $Initial PT Evaluation Tier I: 1 Procedure PT Treatments $Therapeutic Activity: 8-22 mins   PT G Codes:        Christiane Ha, PT, CSCS Pager [phone] Office [phone]  07/23/2015, 4:16 PM

## 2015-07-24 ENCOUNTER — Encounter (HOSPITAL_COMMUNITY): Payer: Self-pay | Admitting: Orthopaedic Surgery

## 2015-07-24 LAB — CBC
HCT: 29.7 % — ABNORMAL LOW (ref 36.0–46.0)
HEMOGLOBIN: 9.5 g/dL — AB (ref 12.0–15.0)
MCH: 31 pg (ref 26.0–34.0)
MCHC: 32 g/dL (ref 30.0–36.0)
MCV: 97.1 fL (ref 78.0–100.0)
Platelets: 295 10*3/uL (ref 150–400)
RBC: 3.06 MIL/uL — ABNORMAL LOW (ref 3.87–5.11)
RDW: 13.4 % (ref 11.5–15.5)
WBC: 10.5 10*3/uL (ref 4.0–10.5)

## 2015-07-24 LAB — GLUCOSE, CAPILLARY: GLUCOSE-CAPILLARY: 104 mg/dL — AB (ref 65–99)

## 2015-07-24 LAB — BASIC METABOLIC PANEL
ANION GAP: 8 (ref 5–15)
BUN: 15 mg/dL (ref 6–20)
CALCIUM: 9.1 mg/dL (ref 8.9–10.3)
CHLORIDE: 102 mmol/L (ref 101–111)
CO2: 28 mmol/L (ref 22–32)
CREATININE: 1 mg/dL (ref 0.44–1.00)
GFR calc non Af Amer: 59 mL/min — ABNORMAL LOW (ref >60)
Glucose, Bld: 144 mg/dL — ABNORMAL HIGH (ref 65–99)
Potassium: 4.2 mmol/L (ref 3.5–5.1)
SODIUM: 138 mmol/L (ref 135–145)

## 2015-07-24 NOTE — Evaluation (Signed)
Occupational Therapy Evaluation Patient Details Name: Angel Wolf MRN: [id number] DOB: 1951-03-24 Today's Date: 07/24/2015    History of Present Illness LEFT TOTAL HIP ARTHROPLASTY ANTERIOR APPROACH   Clinical Impression   Pt overall min guard to min assist for selfcare tasks and simulated toileting transfers using the RW and appropriate AE/DME.  Pt with decreased BP after mobility 89/41,91/40,95/42 in sitting.  Nursing made aware and pt returned to bed.  Oxygen sats 99-100% on room air.  Pt will have initial 24 hour supervision at discharge and is aware of DME needs and AE availability.  No further OT needs at this time.      Follow Up Recommendations  No OT follow up    Equipment Recommendations  3 in 1 bedside comode       Precautions / Restrictions Precautions Precautions: Fall Restrictions Weight Bearing Restrictions: Yes LLE Weight Bearing: Weight bearing as tolerated      Mobility Bed Mobility Overal bed mobility: Needs Assistance Bed Mobility: Supine to Sit     Supine to sit: Min assist        Transfers Overall transfer level: Needs assistance Equipment used: Rolling walker (2 wheeled) Transfers: Sit to/from Stand Sit to Stand: Min assist              Balance Overall balance assessment: Needs assistance Sitting-balance support: No upper extremity supported Sitting balance-Leahy Scale: Fair       Standing balance-Leahy Scale: Poor Standing balance comment: Pt needs use of the RW for UB support when using the RW.                             ADL Overall ADL's : Needs assistance/impaired Eating/Feeding: Independent;Sitting   Grooming: Wash/dry hands;Wash/dry face;Sitting;Set up   Upper Body Bathing: Set up;Sitting   Lower Body Bathing: Minimal assistance;Sit to/from stand   Upper Body Dressing : Supervision/safety;Sitting   Lower Body Dressing: Minimal assistance;Sit to/from stand;With adaptive equipment   Toilet  Transfer: Minimal assistance;Ambulation;RW;BSC   Toileting- Clothing Manipulation and Hygiene: Minimal assistance;Sit to/from stand       Functional mobility during ADLs: Minimal assistance;Rolling walker;Cueing for sequencing General ADL Comments: Pt with min assist for initial sit to stand from the bedside chair, otherwise close supervision for mobility.  Educated pt and family on AE use as pt cannot reach either foot for dressing tasks.  Discussed need for tub/shower bench if pt wishes to shower, however they do not want to purchase at this time.  Pt placed back in bed at end of session per nursing request.  BP decreased after mobility to 89/41, 91/40, 95/42.       Vision Vision Assessment?: No apparent visual deficits   Perception Perception Perception Tested?: No   Praxis Praxis Praxis tested?: Within functional limits    Pertinent Vitals/Pain Pain Assessment: 0-10 Pain Location: Left hip     Hand Dominance Right   Extremity/Trunk Assessment Upper Extremity Assessment Upper Extremity Assessment: Overall WFL for tasks assessed   Lower Extremity Assessment Lower Extremity Assessment: Defer to PT evaluation   Cervical / Trunk Assessment Cervical / Trunk Assessment: Normal   Communication Communication Communication: No difficulties   Cognition Arousal/Alertness: Awake/alert Behavior During Therapy: WFL for tasks assessed/performed Overall Cognitive Status: Within Functional Limits for tasks assessed  Home Living Family/patient expects to be discharged to:: Private residence Living Arrangements: Spouse/significant other;Children Available Help at Discharge: Family Type of Home: House Home Access: Stairs to enter Secretary/administrator of Steps: 2 Entrance Stairs-Rails: Right       Bathroom Shower/Tub: Tub/shower unit;Curtain Shower/tub characteristics: Engineer, building services: Standard     Home Equipment: Environmental consultant -  2 wheels          Prior Functioning/Environment Level of Independence: Independent                                       End of Session Equipment Utilized During Treatment: Engineer, water Communication: Mobility status;Other (comment) (BP)  Activity Tolerance: Patient limited by pain Patient left: in bed   Time: 1414-1448 OT Time Calculation (min): 34 min Charges:  OT General Charges $OT Visit: 1 Procedure OT Evaluation $Initial OT Evaluation Tier I: 1 Procedure OT Treatments $Self Care/Home Management : 8-22 mins  MCGUIRE,JAMES OTR/L 07/24/2015, 3:06 PM

## 2015-07-24 NOTE — Progress Notes (Signed)
PT Cancellation Note  Patient Details Name: Angel Wolf MRN: [id number] DOB: 1951/06/06   Cancelled Treatment:    Reason Eval/Treat Not Completed: Medical issues which prohibited therapy. Patient reports that she is not feeling well, tried getting up a earlier and got very dizzy and nauseated. Does not want to get out of bed at this time. Will continue to follow and progress as patient able to tolerate.    Christiane Ha, PT, CSCS Pager [phone] Office [phone]  07/24/2015, 4:33 PM

## 2015-07-24 NOTE — Progress Notes (Signed)
Subjective: 1 Day Post-Op Procedure(s) (LRB): TOTAL HIP ARTHROPLASTY ANTERIOR APPROACH (Left)  Activity level:  wbat Diet tolerance:  ok Voiding:  ok Patient reports pain as mild and moderate.    Objective: Vital signs in last 24 hours: Temp:  [97.9 F (36.6 C)-98.5 F (36.9 C)] 98 F (36.7 C) (09/28 0440) Pulse Rate:  [51-115] 52 (09/28 0440) Resp:  [9-20] 18 (09/28 0440) BP: (127-171)/(46-71) 136/54 mmHg (09/28 0440) SpO2:  [99 %-100 %] 100 % (09/28 0440) Weight:  [105.235 kg (232 lb)] 105.235 kg (232 lb) (09/27 0806)  Labs:  Recent Labs  07/24/15 0545  HGB 9.5*    Recent Labs  07/24/15 0545  WBC 10.5  RBC 3.06*  HCT 29.7*  PLT 295    Recent Labs  07/24/15 0545  NA 138  K 4.2  CL 102  CO2 28  BUN 15  CREATININE 1.00  GLUCOSE 144*  CALCIUM 9.1   No results for input(s): LABPT, INR in the last 72 hours.  Physical Exam:  Neurologically intact ABD soft Neurovascular intact Sensation intact distally Intact pulses distally Dorsiflexion/Plantar flexion intact Incision: dressing C/D/I and no drainage No cellulitis present Compartment soft  Assessment/Plan:  1 Day Post-Op Procedure(s) (LRB): TOTAL HIP ARTHROPLASTY ANTERIOR APPROACH (Left) Advance diet Up with therapy D/C IV fluids Plan for discharge tomorrow Discharge home with home health if doing well and cleared by PT. Continue on asa  BID x 2 weeks post op. Follow up in office 2 weeks post op.   NIDA, Ginger Organ 07/24/2015, 8:01 AM

## 2015-07-24 NOTE — Care Management Note (Signed)
Case Management Note  Patient Details  Name: MONSERAT PRESTIGIACOMO MRN: [id number] Date of Birth: [birth year]  Subjective/Objective:        S/p left total hip arthroplasty             Action/Plan: Set up with [facility] for HHPT by MD office. Spoke with patent, no change in discharge plan. Patient stated that she has a rolling walker at home, [employer] delivered 3N1 to patient's room. Patient stated that she will have family at home to assist after discharge.     Expected Discharge Date:                  Expected Discharge Plan:  Home w Home Health Services  In-House Referral:  NA  Discharge planning Services  CM Consult  Post Acute Care Choice:  Durable Medical Equipment, Home Health Choice offered to:  Patient  DME Arranged:  3-N-1 DME Agency:  [employer]  HH Arranged:  PT HH Agency:  [facility]  Status of Service:  Completed, signed off  Medicare Important Message Given:    Date Medicare IM Given:    Medicare IM give by:    Date Additional Medicare IM Given:    Additional Medicare Important Message give by:     If discussed at Long Length of Stay Meetings, dates discussed:    Additional Comments:  Monica Becton, RN 07/24/2015, 4:05 PM

## 2015-07-24 NOTE — Progress Notes (Signed)
Physical Therapy Treatment Patient Details Name: Angel Wolf MRN: [id number] DOB: [birth year] Today's Date: 07/24/2015    History of Present Illness LEFT TOTAL HIP ARTHROPLASTY ANTERIOR APPROACH    PT Comments    Patient is making gradual progress with PT.  Patient able to tolerate ambulation on 25 feet with rw. Mobility limited at this time by increasing nausea. Will continue to progress as tolerated. From a mobility standpoint anticipate patient will be ready for DC home with family assistance once her mobility improves.     Follow Up Recommendations  Home health PT;Supervision for mobility/OOB     Equipment Recommendations  None recommended by PT    Recommendations for Other Services       Precautions / Restrictions Precautions Precautions: Fall Restrictions Weight Bearing Restrictions: Yes LLE Weight Bearing: Weight bearing as tolerated    Mobility  Bed Mobility Overal bed mobility: Needs Assistance Bed Mobility: Supine to Sit     Supine to sit: Min guard;HOB elevated     General bed mobility comments: HOB elevated approximately 20 degrees, using rail to assist, slow transitions to sitting  Transfers Overall transfer level: Needs assistance Equipment used: Rolling walker (2 wheeled) Transfers: Sit to/from Stand Sit to Stand: Min guard         General transfer comment: cues needed for hand position for safety  Ambulation/Gait Ambulation/Gait assistance: Min guard Ambulation Distance (Feet): 25 Feet Assistive device: Rolling walker (2 wheeled) Gait Pattern/deviations: Decreased step length - right;Decreased step length - left;Decreased weight shift to left Gait velocity: decreased   General Gait Details: slow and guarded pattern, patient reports having increasing nausea with mobility. Nursing giving medication following session.    Stairs            Wheelchair Mobility    Modified Rankin (Stroke Patients Only)       Balance  Overall balance assessment: Needs assistance Sitting-balance support: No upper extremity supported Sitting balance-Leahy Scale: Fair     Standing balance support: Bilateral upper extremity supported Standing balance-Leahy Scale: Poor Standing balance comment: using rw                    Cognition Arousal/Alertness: Lethargic Behavior During Therapy: Flat affect Overall Cognitive Status: Within Functional Limits for tasks assessed                      Exercises Total Joint Exercises Ankle Circles/Pumps: AROM;Both;15 reps Quad Sets: Left;10 reps;Strengthening Gluteal Sets: Both;10 reps;Strengthening    General Comments        Pertinent Vitals/Pain Pain Assessment: 0-10 Pain Score: 8  Pain Location: Lt hip Pain Descriptors / Indicators: Aching;Sore Pain Intervention(s): Limited activity within patient's tolerance;Monitored during session    Home Living                      Prior Function            PT Goals (current goals can now be found in the care plan section) Acute Rehab PT Goals Patient Stated Goal: go home from hospital PT Goal Formulation: With patient Time For Goal Achievement: 08/06/15 Potential to Achieve Goals: Good Progress towards PT goals: Progressing toward goals    Frequency  7X/week    PT Plan Current plan remains appropriate    Co-evaluation             End of Session Equipment Utilized During Treatment: Gait belt Activity Tolerance: Other (comment) (patient reports incresed nausea  with mobilty, no emesis) Patient left: in chair;with call bell/phone within reach;with family/visitor present     Time: 0827-0853 PT Time Calculation (min) (ACUTE ONLY): 26 min  Charges:  $Gait Training: 8-22 mins $Therapeutic Exercise: 8-22 mins                    G Codes:      Christiane Ha, PT, CSCS Pager [phone] Office [phone]  07/24/2015, 10:25 AM

## 2015-07-25 LAB — TYPE AND SCREEN
ABO/RH(D): A POS
ANTIBODY SCREEN: NEGATIVE
DONOR AG TYPE: NEGATIVE
Donor AG Type: NEGATIVE
Unit division: 0
Unit division: 0

## 2015-07-25 LAB — CBC
HCT: 28.2 % — ABNORMAL LOW (ref 36.0–46.0)
Hemoglobin: 9.1 g/dL — ABNORMAL LOW (ref 12.0–15.0)
MCH: 31.5 pg (ref 26.0–34.0)
MCHC: 32.3 g/dL (ref 30.0–36.0)
MCV: 97.6 fL (ref 78.0–100.0)
PLATELETS: 324 10*3/uL (ref 150–400)
RBC: 2.89 MIL/uL — ABNORMAL LOW (ref 3.87–5.11)
RDW: 13.5 % (ref 11.5–15.5)
WBC: 11.1 10*3/uL — ABNORMAL HIGH (ref 4.0–10.5)

## 2015-07-25 NOTE — Progress Notes (Signed)
Subjective: 2 Days Post-Op Procedure(s) (LRB): TOTAL HIP ARTHROPLASTY ANTERIOR APPROACH (Left)  Patient resting comfortable in chair. She got up and walked well with PT but had some dizziness at the end.  Activity level:  wbat Diet tolerance:  ok Voiding:  ok Patient reports pain as mild.    Objective: Vital signs in last 24 hours: Temp:  [98.3 F (36.8 C)-99.7 F (37.6 C)] 98.3 F (36.8 C) (09/29 1300) Pulse Rate:  [61-70] 61 (09/29 1300) Resp:  [18] 18 (09/29 1300) BP: (114-131)/(43-59) 131/52 mmHg (09/29 1300) SpO2:  [100 %] 100 % (09/29 1300)  Labs:  Recent Labs  07/24/15 0545 07/25/15 0346  HGB 9.5* 9.1*    Recent Labs  07/24/15 0545 07/25/15 0346  WBC 10.5 11.1*  RBC 3.06* 2.89*  HCT 29.7* 28.2*  PLT 295 324    Recent Labs  07/24/15 0545  NA 138  K 4.2  CL 102  CO2 28  BUN 15  CREATININE 1.00  GLUCOSE 144*  CALCIUM 9.1   No results for input(s): LABPT, INR in the last 72 hours.  Physical Exam:  Neurologically intact ABD soft Neurovascular intact Sensation intact distally Intact pulses distally Dorsiflexion/Plantar flexion intact Incision: dressing C/D/I and no drainage No cellulitis present Compartment soft  Assessment/Plan:  2 Days Post-Op Procedure(s) (LRB): TOTAL HIP ARTHROPLASTY ANTERIOR APPROACH (Left) Advance diet Up with therapy Plan for discharge tomorrow Discharge home with home health if doing well and cleared by PT. Follow up in office 2 weeks post op. Continue on ASA  BID x 4 weeks post op.    Angel Wolf, Angel Wolf 07/25/2015, 3:06 PM

## 2015-07-25 NOTE — Progress Notes (Signed)
Physical Therapy Treatment Patient Details Name: Angel Wolf MRN: [id number] DOB: [birth year] Today's Date: 07/25/2015    History of Present Illness LEFT TOTAL HIP ARTHROPLASTY ANTERIOR APPROACH    PT Comments    Patient able to ambulate 75 feet and go up/down 2 stairs, both with supervision. Anticipate patient will D/C to home with family assistance. Patient denies any questions or concerns following session. Will continue to follow to improve mobility and independence.   Follow Up Recommendations  Home health PT;Supervision for mobility/OOB     Equipment Recommendations  None recommended by PT    Recommendations for Other Services       Precautions / Restrictions Precautions Precautions: Fall Restrictions Weight Bearing Restrictions: Yes LLE Weight Bearing: Weight bearing as tolerated    Mobility  Bed Mobility Overal bed mobility: Needs Assistance Bed Mobility: Supine to Sit     Supine to sit: Supervision     General bed mobility comments: HOB flat, no rail  Transfers Overall transfer level: Needs assistance Equipment used: Rolling walker (2 wheeled) Transfers: Sit to/from Stand Sit to Stand: Supervision         General transfer comment: from bed and chair  Ambulation/Gait Ambulation/Gait assistance: Supervision Ambulation Distance (Feet): 75 Feet Assistive device: Rolling walker (2 wheeled) Gait Pattern/deviations: Step-through pattern;Decreased step length - right;Decreased weight shift to left Gait velocity: decreased   General Gait Details: slow pattern, no loss of balance or instability, 2 standing breaks taken during ambulation   Stairs Stairs: Yes Stairs assistance: Min guard Stair Management: One rail Right;Sideways Number of Stairs: 2 General stair comments: patient confirms feeling confident with getting into home.   Wheelchair Mobility    Modified Rankin (Stroke Patients Only)       Balance Overall balance assessment:  Needs assistance Sitting-balance support: No upper extremity supported Sitting balance-Leahy Scale: Good     Standing balance support: During functional activity Standing balance-Leahy Scale: Fair Standing balance comment: using rw for ambulation                    Cognition Arousal/Alertness: Awake/alert Behavior During Therapy: Flat affect Overall Cognitive Status: Within Functional Limits for tasks assessed                      Exercises      General Comments        Pertinent Vitals/Pain Pain Assessment: 0-10 Pain Score: 0-No pain (while at rest in bed,prior to starting session) Pain Intervention(s): Limited activity within patient's tolerance;Monitored during session;Repositioned    Home Living                      Prior Function            PT Goals (current goals can now be found in the care plan section) Acute Rehab PT Goals Patient Stated Goal: go home from hospital PT Goal Formulation: With patient Time For Goal Achievement: 08/06/15 Potential to Achieve Goals: Good Progress towards PT goals: Progressing toward goals    Frequency  7X/week    PT Plan Current plan remains appropriate    Co-evaluation             End of Session Equipment Utilized During Treatment: Gait belt Activity Tolerance: Patient limited by fatigue Patient left: in chair;with call bell/phone within reach     Time: 9562-1308 PT Time Calculation (min) (ACUTE ONLY): 26 min  Charges:  $Gait Training: 23-37 mins  G Codes:      Christiane Ha, PT, CSCS Pager [phone] Office [phone]  07/25/2015, 2:44 PM

## 2015-07-26 LAB — CBC
HEMATOCRIT: 28.5 % — AB (ref 36.0–46.0)
HEMOGLOBIN: 9.4 g/dL — AB (ref 12.0–15.0)
MCH: 32.2 pg (ref 26.0–34.0)
MCHC: 33 g/dL (ref 30.0–36.0)
MCV: 97.6 fL (ref 78.0–100.0)
Platelets: 259 10*3/uL (ref 150–400)
RBC: 2.92 MIL/uL — ABNORMAL LOW (ref 3.87–5.11)
RDW: 13.1 % (ref 11.5–15.5)
WBC: 10.9 10*3/uL — ABNORMAL HIGH (ref 4.0–10.5)

## 2015-07-26 MED ORDER — METHOCARBAMOL 500 MG PO TABS: 500.0000 mg | ORAL_TABLET | Freq: Four times a day (QID) | ORAL | Status: AC | PRN

## 2015-07-26 MED ORDER — ASPIRIN 325 MG PO TBEC
325.0000 mg | DELAYED_RELEASE_TABLET | Freq: Two times a day (BID) | ORAL | Status: AC
Start: 2015-07-26 — End: ?

## 2015-07-26 MED ORDER — HYDROCODONE-ACETAMINOPHEN 7.5-325 MG PO TABS: 1.0000 | ORAL_TABLET | ORAL | Status: AC | PRN

## 2015-07-26 NOTE — Discharge Summary (Signed)
Patient ID: Angel Wolf MRN: [id number] DOB/AGE: 63-Nov-1952 63 y.o.  Admit date: 07/23/2015 Discharge date: 07/26/2015  Admission Diagnoses:  Principal Problem:   Primary osteoarthritis of left hip   Discharge Diagnoses:  Same  Past Medical History  Diagnosis Date  . Frequent urination     takes Ditropan daily  . Hyperlipemia     takes Atorvastatin daily  . Hypertension     takes Hyzaar daily  . Insomnia     takes Trazodone nightly  . Family history of adverse reaction to anesthesia     daughter fights as waking up from anesthesia  . Pneumonia as a child  . Headache     occasionally  . Seizures as a child  . Arthritis   . Joint pain   . Peripheral edema     takes Lasix daily  . Back pain     compensating from hips  . History of colon polyps     benign  . History of blood transfusion     Surgeries: Procedure(s): TOTAL HIP ARTHROPLASTY ANTERIOR APPROACH on 07/23/2015   Consultants:    Discharged Condition: Improved  Hospital Course: Angel Wolf is an 64 y.o. female who was admitted 07/23/2015 for operative treatment ofPrimary osteoarthritis of left hip. Patient has severe unremitting pain that affects sleep, daily activities, and work/hobbies. After pre-op clearance the patient was taken to the operating room on 07/23/2015 and underwent  Procedure(s): TOTAL HIP ARTHROPLASTY ANTERIOR APPROACH.    Patient was given perioperative antibiotics: Anti-infectives    Start     Dose/Rate Route Frequency Ordered Stop   07/23/15 1600  ceFAZolin (ANCEF) IVPB 2 g/50 mL premix     2 g 100 mL/hr over 30 Minutes Intravenous Every 6 hours 07/23/15 1523 07/23/15 2141   07/23/15 0930  ceFAZolin (ANCEF) IVPB 2 g/50 mL premix     2 g 100 mL/hr over 30 Minutes Intravenous To ShortStay Surgical 07/22/15 1102 07/23/15 1017       Patient was given sequential compression devices, early ambulation, and chemoprophylaxis to prevent DVT.  Patient benefited maximally  from hospital stay and there were no complications.    Recent vital signs: Patient Vitals for the past 24 hrs:  BP Temp Temp src Pulse Resp SpO2  07/26/15 0609 (!) 130/48 mmHg 98.4 F (36.9 C) - 66 18 94 %  07/25/15 2013 (!) 117/42 mmHg 98.3 F (36.8 C) Oral 65 18 98 %  07/25/15 1300 (!) 131/52 mmHg 98.3 F (36.8 C) Oral 61 18 100 %     Recent laboratory studies:  Recent Labs  07/24/15 0545 07/25/15 0346 07/26/15 0358  WBC 10.5 11.1* 10.9*  HGB 9.5* 9.1* 9.4*  HCT 29.7* 28.2* 28.5*  PLT 295 324 259  NA 138  --   --   K 4.2  --   --   CL 102  --   --   CO2 28  --   --   BUN 15  --   --   CREATININE 1.00  --   --   GLUCOSE 144*  --   --   CALCIUM 9.1  --   --      Discharge Medications:     Medication List    STOP taking these medications        etodolac 400 MG tablet  Commonly known as:  LODINE      TAKE these medications        aspirin 325 MG EC tablet  Take 1 tablet (325 mg total) by mouth 2 (two) times daily.     atorvastatin 10 MG tablet  Commonly known as:  LIPITOR  Take 10 mg by mouth daily.     ferrous sulfate 325 (65 FE) MG tablet  Take 1 tablet (325 mg total) by mouth daily with breakfast.     furosemide 20 MG tablet  Commonly known as:  LASIX  Take 20 mg by mouth 2 (two) times daily.     HYDROcodone-acetaminophen 7.5-325 MG tablet  Commonly known as:  NORCO  Take 1-2 tablets by mouth every 4 (four) hours as needed.     losartan-hydrochlorothiazide 100-12.5 MG tablet  Commonly known as:  HYZAAR  Take 1 tablet by mouth daily.     methocarbamol 500 MG tablet  Commonly known as:  ROBAXIN  Take 1 tablet (500 mg total) by mouth every 6 (six) hours as needed.     oxybutynin 5 MG tablet  Commonly known as:  DITROPAN  Take 5 mg by mouth daily.     traZODone 50 MG tablet  Commonly known as:  DESYREL  Take 50 mg by mouth at bedtime.     Vitamin D (Ergocalciferol) 50000 UNITS Caps capsule  Commonly known as:  DRISDOL  Take 50,000 Units by  mouth every 7 (seven) days. On thursdays each week        Diagnostic Studies: Dg Chest 2 View  07/11/2015   CLINICAL DATA:  Preop total hip arthroplasty.  Hypertension.  EXAM: CHEST  2 VIEW  COMPARISON:  01/04/2015  FINDINGS: Heart and mediastinal contours are within normal limits. No focal opacities or effusions. No acute bony abnormality.  IMPRESSION: Negative   Electronically Signed   By: Charlett Nose M.D.   On: 07/11/2015 10:09   Dg Hip Operative Unilat With Pelvis Left  07/23/2015   CLINICAL DATA:  Left total anterior hip arthroplasty  EXAM: OPERATIVE LEFT HIP (WITH PELVIS IF PERFORMED) 2 VIEWS  TECHNIQUE: Fluoroscopic spot image(s) were submitted for interpretation post-operatively.  Fluoroscopy time 0 minutes 30 seconds  COMPARISON:  None.  FINDINGS: Total left hip replacement. Femoral and acetabular components appear in anticipated position.  IMPRESSION: Anticipated postoperative appearance of the left hip   Electronically Signed   By: Esperanza Heir M.D.   On: 07/23/2015 12:13    Disposition: 01-Home or Self Care      Discharge Instructions    Call MD / Call 911    Complete by:  As directed   If you experience chest pain or shortness of breath, CALL 911 and be transported to the hospital emergency room.  If you develope a fever above 101 F, pus (white drainage) or increased drainage or redness at the wound, or calf pain, call your surgeon's office.     Constipation Prevention    Complete by:  As directed   Drink plenty of fluids.  Prune juice may be helpful.  You may use a stool softener, such as Colace (over the counter) 100 mg twice a day.  Use MiraLax (over the counter) for constipation as needed.     Diet - low sodium heart healthy    Complete by:  As directed      Discharge instructions    Complete by:  As directed   INSTRUCTIONS AFTER JOINT REPLACEMENT   Remove items at home which could result in a fall. This includes throw rugs or furniture in walking pathways ICE to the  affected joint every three hours while  awake for 30 minutes at a time, for at least the first 3-5 days, and then as needed for pain and swelling.  Continue to use ice for pain and swelling. You may notice swelling that will progress down to the foot and ankle.  This is normal after surgery.  Elevate your leg when you are not up walking on it.   Continue to use the breathing machine you got in the hospital (incentive spirometer) which will help keep your temperature down.  It is common for your temperature to cycle up and down following surgery, especially at night when you are not up moving around and exerting yourself.  The breathing machine keeps your lungs expanded and your temperature down.   DIET:  As you were doing prior to hospitalization, we recommend a well-balanced diet.  DRESSING / WOUND CARE / SHOWERING  You may shower 3 days after surgery, but keep the wounds dry during showering.  You may use an occlusive plastic wrap (Press'n Seal for example), NO SOAKING/SUBMERGING IN THE BATHTUB.  If the bandage gets wet, change with a clean dry gauze.  If the incision gets wet, pat the wound dry with a clean towel.  ACTIVITY  Increase activity slowly as tolerated, but follow the weight bearing instructions below.   No driving for 6 weeks or until further direction given by your physician.  You cannot drive while taking narcotics.  No lifting or carrying greater than 10 lbs. until further directed by your surgeon. Avoid periods of inactivity such as sitting longer than an hour when not asleep. This helps prevent blood clots.  You may return to work once you are authorized by your doctor.     WEIGHT BEARING   Weight bearing as tolerated with assist device (walker, cane, etc) as directed, use it as long as suggested by your surgeon or therapist, typically at least 4-6 weeks.   EXERCISES  Results after joint replacement surgery are often greatly improved when you follow the exercise, range of  motion and muscle strengthening exercises prescribed by your doctor. Safety measures are also important to protect the joint from further injury. Any time any of these exercises cause you to have increased pain or swelling, decrease what you are doing until you are comfortable again and then slowly increase them. If you have problems or questions, call your caregiver or physical therapist for advice.   Rehabilitation is important following a joint replacement. After just a few days of immobilization, the muscles of the leg can become weakened and shrink (atrophy).  These exercises are designed to build up the tone and strength of the thigh and leg muscles and to improve motion. Often times heat used for twenty to thirty minutes before working out will loosen up your tissues and help with improving the range of motion but do not use heat for the first two weeks following surgery (sometimes heat can increase post-operative swelling).   These exercises can be done on a training (exercise) mat, on the floor, on a table or on a bed. Use whatever works the best and is most comfortable for you.    Use music or television while you are exercising so that the exercises are a pleasant break in your day. This will make your life better with the exercises acting as a break in your routine that you can look forward to.   Perform all exercises about fifteen times, three times per day or as directed.  You should exercise both the operative  leg and the other leg as well.   Exercises include:   Quad Sets - Tighten up the muscle on the front of the thigh (Quad) and hold for 5-10 seconds.   Straight Leg Raises - With your knee straight (if you were given a brace, keep it on), lift the leg to 60 degrees, hold for 3 seconds, and slowly lower the leg.  Perform this exercise against resistance later as your leg gets stronger.  Leg Slides: Lying on your back, slowly slide your foot toward your buttocks, bending your knee up off  the floor (only go as far as is comfortable). Then slowly slide your foot back down until your leg is flat on the floor again.  Angel Wings: Lying on your back spread your legs to the side as far apart as you can without causing discomfort.  Hamstring Strength:  Lying on your back, push your heel against the floor with your leg straight by tightening up the muscles of your buttocks.  Repeat, but this time bend your knee to a comfortable angle, and push your heel against the floor.  You may put a pillow under the heel to make it more comfortable if necessary.   A rehabilitation program following joint replacement surgery can speed recovery and prevent re-injury in the future due to weakened muscles. Contact your doctor or a physical therapist for more information on knee rehabilitation.    CONSTIPATION  Constipation is defined medically as fewer than three stools per week and severe constipation as less than one stool per week.  Even if you have a regular bowel pattern at home, your normal regimen is likely to be disrupted due to multiple reasons following surgery.  Combination of anesthesia, postoperative narcotics, change in appetite and fluid intake all can affect your bowels.   YOU MUST use at least one of the following options; they are listed in order of increasing strength to get the job done.  They are all available over the counter, and you may need to use some, POSSIBLY even all of these options:    Drink plenty of fluids (prune juice may be helpful) and high fiber foods Colace 100 mg by mouth twice a day  Senokot for constipation as directed and as needed Dulcolax (bisacodyl), take with full glass of water  Miralax (polyethylene glycol) once or twice a day as needed.  If you have tried all these things and are unable to have a bowel movement in the first 3-4 days after surgery call either your surgeon or your primary doctor.    If you experience loose stools or diarrhea, hold the  medications until you stool forms back up.  If your symptoms do not get better within 1 week or if they get worse, check with your doctor.  If you experience "the worst abdominal pain ever" or develop nausea or vomiting, please contact the office immediately for further recommendations for treatment.   ITCHING:  If you experience itching with your medications, try taking only a single pain pill, or even half a pain pill at a time.  You can also use Benadryl over the counter for itching or also to help with sleep.   TED HOSE STOCKINGS:  Use stockings on both legs until for at least 2 weeks or as directed by physician office. They may be removed at night for sleeping.  MEDICATIONS:  See your medication summary on the "After Visit Summary" that nursing will review with you.  You may have some  home medications which will be placed on hold until you complete the course of blood thinner medication.  It is important for you to complete the blood thinner medication as prescribed.  PRECAUTIONS:  If you experience chest pain or shortness of breath - call 911 immediately for transfer to the hospital emergency department.   If you develop a fever greater that 101 F, purulent drainage from wound, increased redness or drainage from wound, foul odor from the wound/dressing, or calf pain - CONTACT YOUR SURGEON.                                                   FOLLOW-UP APPOINTMENTS:  If you do not already have a post-op appointment, please call the office for an appointment to be seen by your surgeon.  Guidelines for how soon to be seen are listed in your "After Visit Summary", but are typically between 1-4 weeks after surgery.  OTHER INSTRUCTIONS:   Knee Replacement:  Do not place pillow under knee, focus on keeping the knee straight while resting. CPM instructions: 0-90 degrees, 2 hours in the morning, 2 hours in the afternoon, and 2 hours in the evening. Place foam block, curve side up under heel at all times  except when in CPM or when walking.  DO NOT modify, tear, cut, or change the foam block in any way.  MAKE SURE YOU:  Understand these instructions.  Get help right away if you are not doing well or get worse.    Thank you for letting us be a part of your medical care team.  It is a privilege we respect greatly.  We hope these instructions will help you stay on track for a fast and full recovery!     Increase activity slowly as tolerated    Complete by:  As directed            Follow-up Information    Follow up with Velna Ochs, MD. Schedule an appointment as soon as possible for a visit in 2 weeks.   Specialty:  Orthopedic Surgery   Contact information:   [address] [place] [address] [phone]       Follow up with [facility].   Why:  They will contact you to schedule home therapy visits.   Contact information:   [address] [place] [address] [phone]        Signed: Drema Halon 07/26/2015, 7:53 AM

## 2015-07-26 NOTE — Progress Notes (Signed)
Physical Therapy Treatment Patient Details Name: Angel Wolf MRN: [id number] DOB: 1951/06/04 Today's Date: 07/26/2015    History of Present Illness LEFT TOTAL HIP ARTHROPLASTY ANTERIOR APPROACH    PT Comments    Patient is making good progress with PT.  From a mobility standpoint anticipate patient will be ready for DC home with family assistance. Following session, patient and spouse deny questions or concerns regarding mobility or HEP. Patient states she feels good about going home.      Follow Up Recommendations  Home health PT;Supervision for mobility/OOB     Equipment Recommendations  None recommended by PT    Recommendations for Other Services       Precautions / Restrictions Precautions Precautions: Fall Precaution Comments: HEP provided and reviewed Restrictions Weight Bearing Restrictions: Yes LLE Weight Bearing: Weight bearing as tolerated    Mobility  Bed Mobility               General bed mobility comments: found and returned to chair, patient reports that she feels confident with getting in and out of bed. Declines reviewing.   Transfers Overall transfer level: Needs assistance Equipment used: Rolling walker (2 wheeled) Transfers: Sit to/from Stand Sit to Stand: Supervision         General transfer comment: no cues needed  Ambulation/Gait Ambulation/Gait assistance: Supervision Ambulation Distance (Feet): 100 Feet Assistive device: Rolling walker (2 wheeled) Gait Pattern/deviations: Step-through pattern;Decreased step length - right;Decreased weight shift to left Gait velocity: decreased   General Gait Details: slow but steady pattern with rw   Stairs Stairs: Yes Stairs assistance: Min guard Stair Management: No rails;Backwards;Forwards (up backwards, down forwards) Number of Stairs: 2 General stair comments: review of 2 steps with walker approach. Patient and spouse report feeling confident with mobility technique. Decline  second attempt.   Wheelchair Mobility    Modified Rankin (Stroke Patients Only)       Balance Overall balance assessment: Needs assistance Sitting-balance support: No upper extremity supported Sitting balance-Leahy Scale: Good     Standing balance support: During functional activity Standing balance-Leahy Scale: Fair Standing balance comment: using rw for ambulation                    Cognition Arousal/Alertness: Awake/alert Behavior During Therapy: Flat affect Overall Cognitive Status: Within Functional Limits for tasks assessed                      Exercises Total Joint Exercises Ankle Circles/Pumps: AROM;Both;15 reps Quad Sets: Left;10 reps;Strengthening Short Arc Quad: Left;Strengthening;10 reps Heel Slides: AAROM;Left;10 reps Hip ABduction/ADduction: Strengthening;Left;10 reps Long Arc Quad: Strengthening;Left;10 reps    General Comments        Pertinent Vitals/Pain Pain Assessment: 0-10 Pain Score: 2  Pain Location: Lt hip Pain Descriptors / Indicators: Tightness Pain Intervention(s): Limited activity within patient's tolerance;Monitored during session    Home Living                      Prior Function            PT Goals (current goals can now be found in the care plan section) Acute Rehab PT Goals Patient Stated Goal: go home PT Goal Formulation: With patient Time For Goal Achievement: 08/06/15 Potential to Achieve Goals: Good Progress towards PT goals: Progressing toward goals    Frequency  7X/week    PT Plan Current plan remains appropriate    Co-evaluation  End of Session Equipment Utilized During Treatment: Gait belt Activity Tolerance: Patient limited by fatigue Patient left: in chair;with call bell/phone within reach;with family/visitor present     Time: 1610-9604 PT Time Calculation (min) (ACUTE ONLY): 28 min  Charges:  $Gait Training: 8-22 mins $Therapeutic Exercise: 8-22 mins                     G Codes:      Christiane Ha, PT, CSCS Pager [phone] Office [phone]  07/26/2015, 11:53 AM

## 2015-07-26 NOTE — Progress Notes (Signed)
Patient prepared for d/c including D/C teaching, AVS reviewed/ signed and prescriptions reviewed/ given by CN. Volunteer will assist patient and family to exit via w/c.

## 2016-12-23 IMAGING — CR DG CHEST 2V
2 series · 2 of 2 positions shown · non-contrast
Comparison: 01/04/2015

CLINICAL DATA: Preop total hip arthroplasty.  Hypertension.

EXAM:
CHEST  2 VIEW

[w chest pa]
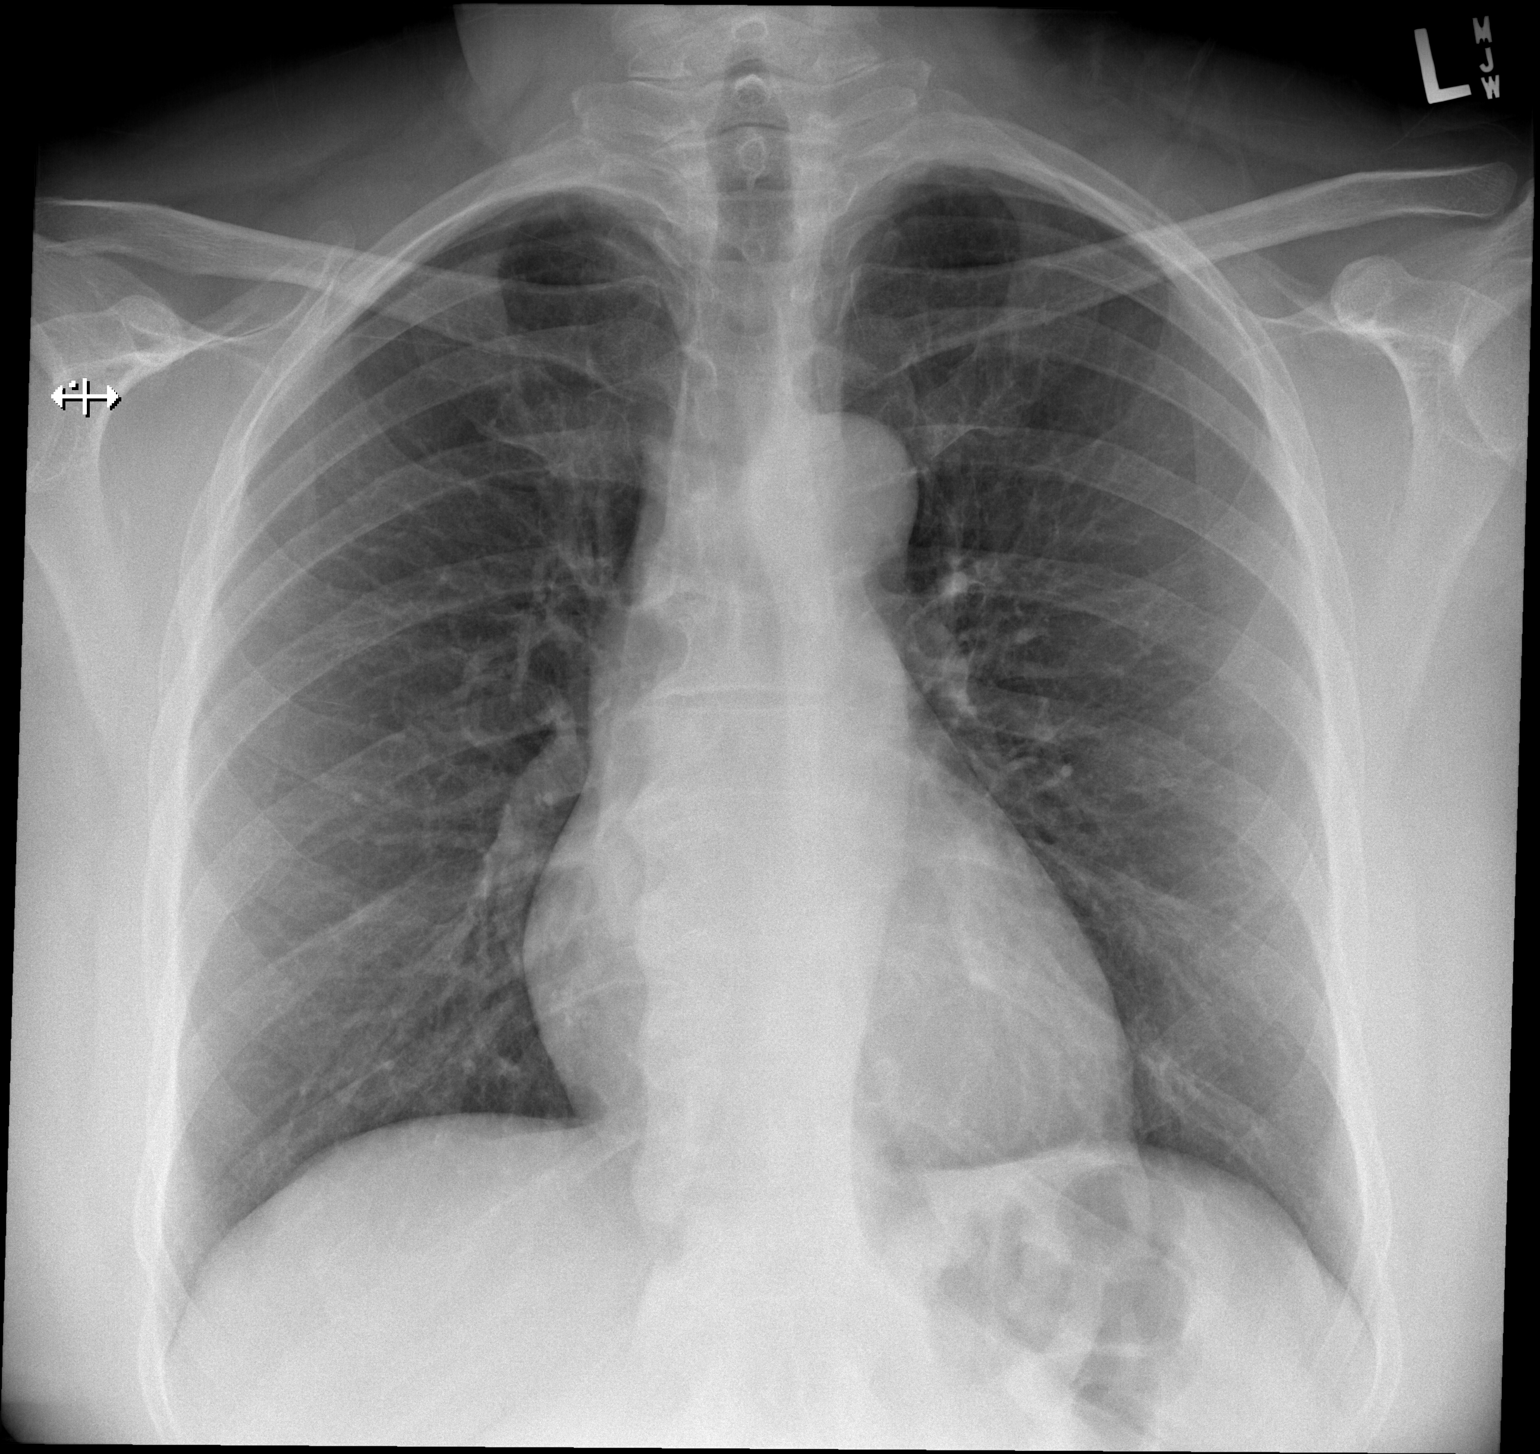

[w chest lat]
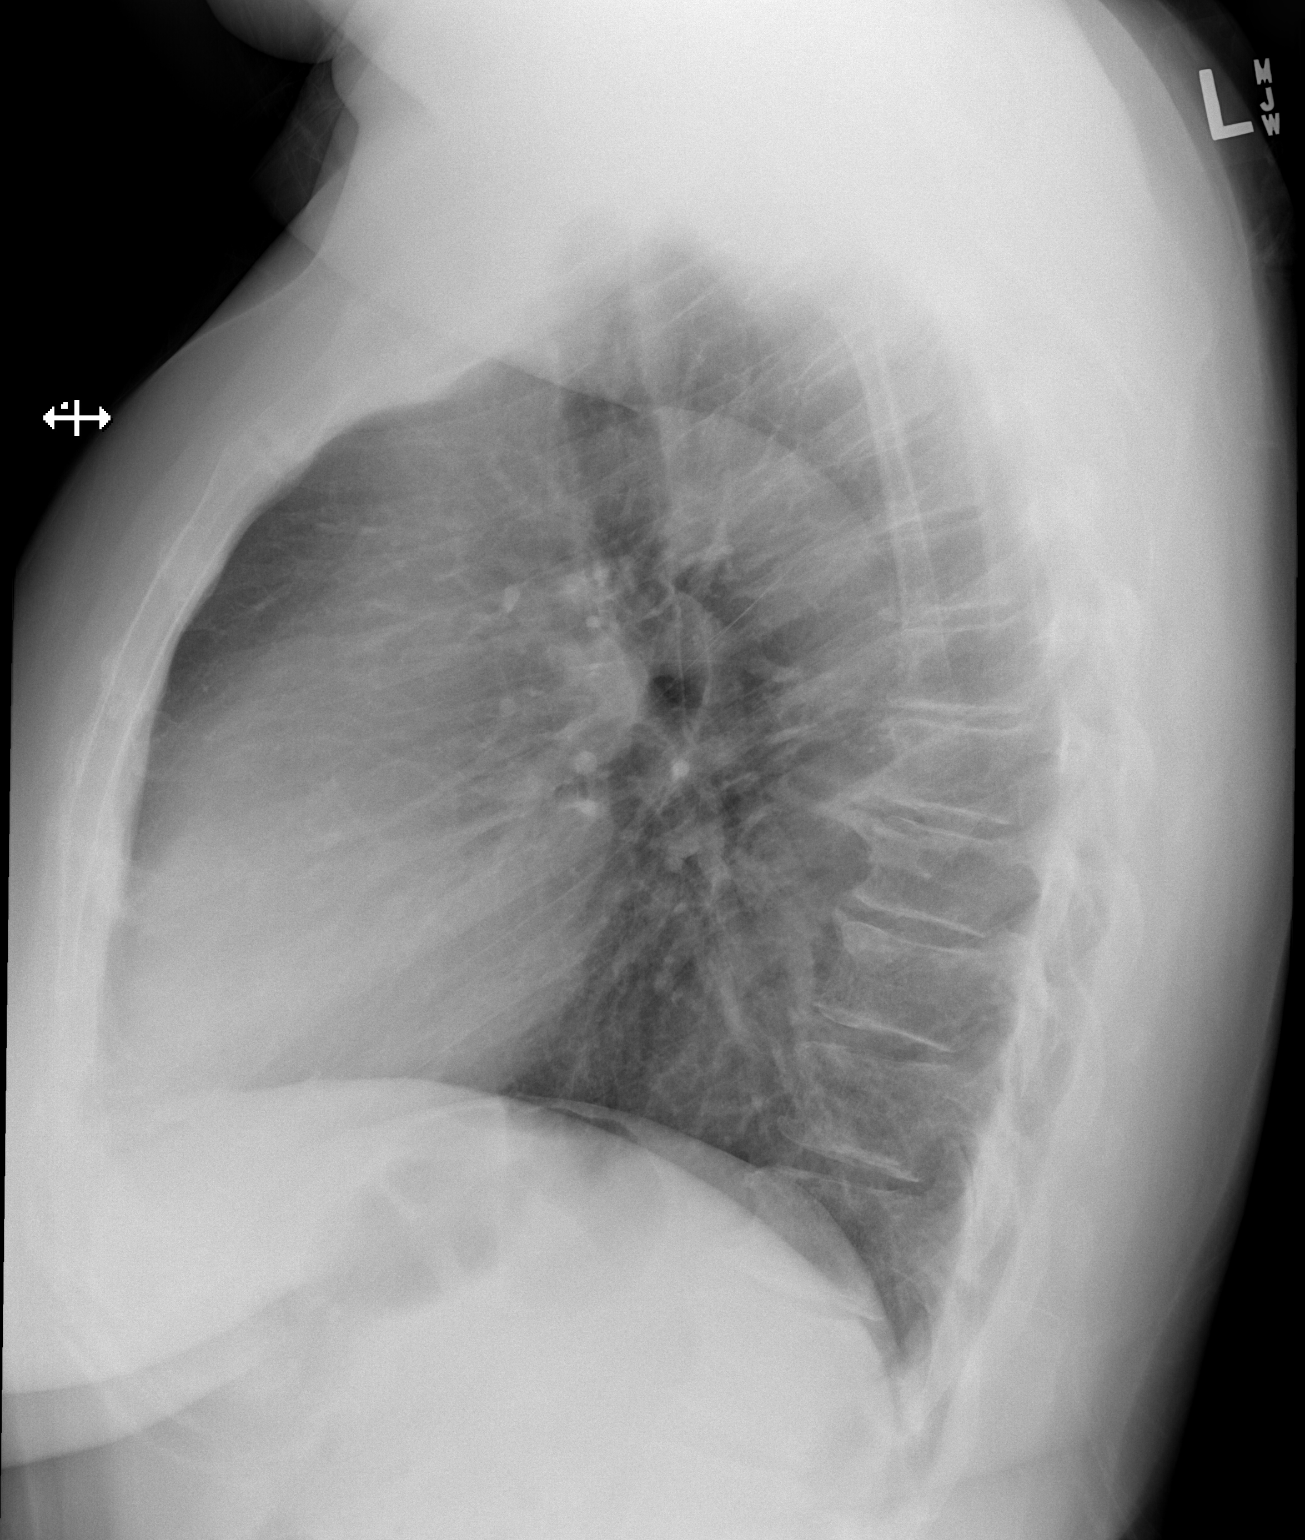

[2 of 2 positions shown; findings below may reference images not displayed]

FINDINGS: Heart and mediastinal contours are within normal limits. No focal
opacities or effusions. No acute bony abnormality.
IMPRESSION: Negative
